# Patient Record
Sex: Female | Born: 1965 | Race: White | Hispanic: No | Marital: Married | Smoking: Never smoker
Health system: Southern US, Community
[De-identification: ages and names within clinical notes are randomized; demographics above are authoritative.]

## PROBLEM LIST (undated history)

## (undated) DIAGNOSIS — J45909 Unspecified asthma, uncomplicated: Secondary | ICD-10-CM

## (undated) DIAGNOSIS — J309 Allergic rhinitis, unspecified: Secondary | ICD-10-CM

## (undated) DIAGNOSIS — Z91018 Allergy to other foods: Secondary | ICD-10-CM

## (undated) HISTORY — DX: Allergy to other foods: Z91.018

## (undated) HISTORY — DX: Allergic rhinitis, unspecified: J30.9

## (undated) HISTORY — DX: Unspecified asthma, uncomplicated: J45.909

## (undated) HISTORY — PX: NASAL SEPTUM SURGERY: SHX37

---

## 1998-09-24 ENCOUNTER — Other Ambulatory Visit: Admission: RE | Admit: 1998-09-24 | Discharge: 1998-09-24 | Payer: Self-pay | Admitting: *Deleted

## 1999-04-13 ENCOUNTER — Other Ambulatory Visit: Admission: RE | Admit: 1999-04-13 | Discharge: 1999-04-13 | Payer: Self-pay | Admitting: *Deleted

## 2000-03-01 ENCOUNTER — Other Ambulatory Visit: Admission: RE | Admit: 2000-03-01 | Discharge: 2000-03-01 | Payer: Self-pay | Admitting: *Deleted

## 2001-05-07 ENCOUNTER — Other Ambulatory Visit: Admission: RE | Admit: 2001-05-07 | Discharge: 2001-05-07 | Payer: Self-pay | Admitting: Obstetrics and Gynecology

## 2001-10-22 ENCOUNTER — Encounter: Admission: RE | Admit: 2001-10-22 | Discharge: 2001-10-22 | Payer: Self-pay | Admitting: *Deleted

## 2002-07-15 ENCOUNTER — Other Ambulatory Visit: Admission: RE | Admit: 2002-07-15 | Discharge: 2002-07-15 | Payer: Self-pay | Admitting: Obstetrics and Gynecology

## 2002-09-07 ENCOUNTER — Encounter: Payer: Self-pay | Admitting: Emergency Medicine

## 2002-09-07 ENCOUNTER — Emergency Department (HOSPITAL_COMMUNITY): Admission: EM | Admit: 2002-09-07 | Discharge: 2002-09-08 | Payer: Self-pay | Admitting: Emergency Medicine

## 2002-09-23 ENCOUNTER — Encounter: Admission: RE | Admit: 2002-09-23 | Discharge: 2002-11-14 | Payer: Self-pay | Admitting: *Deleted

## 2003-07-17 ENCOUNTER — Other Ambulatory Visit: Admission: RE | Admit: 2003-07-17 | Discharge: 2003-07-17 | Payer: Self-pay | Admitting: Obstetrics and Gynecology

## 2004-04-02 ENCOUNTER — Encounter: Admission: RE | Admit: 2004-04-02 | Discharge: 2004-04-02 | Payer: Self-pay | Admitting: Obstetrics and Gynecology

## 2006-01-27 ENCOUNTER — Emergency Department (HOSPITAL_COMMUNITY): Admission: EM | Admit: 2006-01-27 | Discharge: 2006-01-27 | Payer: Self-pay | Admitting: Emergency Medicine

## 2006-02-11 ENCOUNTER — Inpatient Hospital Stay (HOSPITAL_COMMUNITY): Admission: EM | Admit: 2006-02-11 | Discharge: 2006-02-16 | Payer: Self-pay | Admitting: Emergency Medicine

## 2007-09-20 ENCOUNTER — Encounter: Admission: RE | Admit: 2007-09-20 | Discharge: 2007-09-20 | Payer: Self-pay | Admitting: *Deleted

## 2009-08-06 ENCOUNTER — Encounter: Admission: RE | Admit: 2009-08-06 | Discharge: 2009-08-06 | Payer: Self-pay | Admitting: Obstetrics and Gynecology

## 2010-06-13 ENCOUNTER — Encounter: Payer: Self-pay | Admitting: *Deleted

## 2010-10-08 NOTE — Discharge Summary (Signed)
NAMEEMREE, LOCICERO                 ACCOUNT NO.:  [id number]   MEDICAL RECORD NO.:  [id number]          PATIENT TYPE:  INP   LOCATION:  5705                         FACILITY:  [facility]   PHYSICIAN:  Sharlet Salina T. Hoxworth, M.D.DATE OF BIRTH:  [birth year]   DATE OF ADMISSION:  02/11/2006  DATE OF DISCHARGE:  02/16/2006                               DISCHARGE SUMMARY   CHIEF COMPLAINT/REASON FOR ADMISSION:  Michelle Crawford is a 45 year old female  patient who has had less than 24 hours' worth of nausea and vomiting.  She was seen by her primary care physician and was given Phenergan for  supportive care.  Because her symptoms intensified, she presented to  [employer] [facility] for further evaluation.  CT of the  abdomen and pelvis was done as part of a routine workup and this  demonstrated a small bowel obstruction with transition point in her  distal ileum.  She is still having pain at the time with Dr. Carollee Crawford  evaluation.  On exam her vital signs were stable.  She was afebrile.  Her abdomen was mildly distended with scattered occasional bowel sounds  without significant tenderness, no organomegaly.  White count was  moderately elevated at 20,900, hemoglobin 14.4.  Sodium 36, potassium  3.5, CO2 26, BUN 24, creatinine 0.8, glucose 159.  LFTs normal.  CT as  noted.   ADMISSION DIAGNOSES:  The patient was admitted with following diagnoses.  1. Small bowel obstructions possibly due to adhesions from prior      cesarean section.  2. Leukocytosis and dehydration.   HOSPITAL COURSE:  The patient was admitted to the floor initially for  bowel rest and continued observation.  An NG tube was placed.  She was  started on IV fluids and serial labs were monitored.  By later that  night, she had a low grade fever of 99.8.  Her abdomen was much more  comfortable after the placement of the NG tube with only mild abdominal  pain.  Her abdomen was soft with mild abdominal tenderness without  guarding.  Repeat x-rays were planned for the morning.   By the following day, the patient was having a headache related to the  Dilaudid.  Repeat x-ray showed CT contrast in the colon with improving  partial small bowel obstruction.  Her abdomen was soft.  She had active  bowel sounds and the abdomen was nontender.  NG tube was continued.  Dilaudid was discontinued because of headache.   By day #2, the patient was afebrile, vital signs were stable.  Her white  count had normalized to 5700, potassium was slightly low at 3.4.  She  had still not had a bowel movement yet and she had had 800 mL out of her  NG tube in the past 24 hours, but was passing a small amount of flatus.  She complains that her abdomen was still distended.  On exam her abdomen  was distended and soft and nontender.  No bowel sounds were auscultated  and NG returns were bilious.  NG tube remained in place because of  increased NG output and repeat labs were checked.   By day #3, the patient was still having low grade fever but now she was  having more flatus.  White count was normal at 8100.  X-ray shows maybe  minimal decrease in small bowel dilatation and she was still having gas  in the colon.  NG tube was discontinued and clear liquid diet was  started.  Over the next two days, patient continued to improve.  She was  tolerating a clear liquid diet.  She was now passing stool.  Her  potassium was normal at 3.6.  She was having no further postprandial  bloating.  Her diet was advanced and she was instructed on not eating  corn nuts, lettuce or other high residue diet until instructed that this  was okay by a physician and the patient was told that if she was doing  well by February 16, 2006, she would be appropriate for discharge home.  She was still complaining of some mild nausea with her diet but having  flatus. She stated she still felt somewhat uncomfortable and bloated in  the lower abdomen, but exam was  otherwise normal.  A repeat x-ray showed  no obstruction.  These were reviewed with Dr. Johna Sheriff and the patient  was deemed appropriate for discharge home.   FINAL DISCHARGE DIAGNOSES:  1. Abdominal pain secondary to partial small bowel obstruction,      resolved.  2. History of prior cesarean section.  3. Leukocytosis and low grade fever, resolved.   DISCHARGE MEDICATIONS:  Resume home medications.  Pain management  Tylenol over-the-counter for pain.  If you are developing increased  abdominal pain, you need to call the surgeon immediately.  You are also  to call the surgeon if you have prior symptoms or increased belly pain.  Return to work Monday February 20, 2006.   DIET:  Avoid raw vegetables, whole kernel corn, nuts and seeds for least  2 weeks.   FOLLOW UP:  You need to follow up with Dr. Johna Sheriff as needed.      Allison L. Rennis Harding, N.P.      Lorne Skeens. Hoxworth, M.D.  Electronically Signed    ALE/MEDQ  D:  04/10/2006  T:  04/10/2006  Job:  [id number]

## 2010-10-08 NOTE — H&P (Signed)
Michelle Crawford                 ACCOUNT NO.:  [id number]   MEDICAL RECORD NO.:  [id number]          PATIENT TYPE:  INP   LOCATION:  5729                         FACILITY:  [facility]   PHYSICIAN:  Gabrielle Dare. Janee Morn, M.D.DATE OF BIRTH:  18-Sep-1965   DATE OF ADMISSION:  02/11/2006  DATE OF DISCHARGE:                                HISTORY & PHYSICAL   CHIEF COMPLAINT:  Nausea and vomiting.   HISTORY OF PRESENT ILLNESS:  The patient is a 45 year old white female who  developed nausea and vomiting yesterday evening.  The patient was seen by  her primary physician and prescribed Phenergan.  She came to [facility]gency [facility] for further evaluation.  Workup included CT scan of the  abdomen and pelvis which shows a small bowel obstruction with transition  point in her distal ileum.  She is having some mild pain at this time.   PAST MEDICAL HISTORY:  Negative.   PAST SURGICAL HISTORY:  Cesarean section and nasal surgery.   ALLERGIES:  None.   MEDICATIONS:  Phenergan.   REVIEW OF SYSTEMS:  CARDIAC:  Negative.  PULMONARY:  Negative.  GI: See  above.  GU: Negative.  MUSCULOSKELETAL:  Negative.  NEUROPSYCH:  Negative.  The remainder of review of systems is negative.   PHYSICAL EXAMINATION:  VITAL SIGNS:  Temperature is 98.8.  Blood pressure  109/65.  Heart rate 70, respirations 18.  GENERAL:  She is awake, alert and oriented.  HEENT:  Pupils are equal.  Sclerae is clear.  NECK:  Supple with no masses.  LUNGS:  Clear to auscultation with good respiratory excursion.  HEART:  Regular.  Impulse is palpable in the left chest.  ABDOMEN:  Mildly distended.  There are scattered occasional bowel sounds.  There is no significant tenderness.  No organomegaly is noted.  SKIN:  Warm and dry with no rashes.  EXTREMITIES:  Warm with good distal pulses.   DATA REVIEWED:  Includes white blood cell count 20.9, hemoglobin 14.4,  sodium 136, potassium 3.5, chloride 102, CO2 of 26, BUN 24,  creatinine 0.8,  glucose 159.  Liver function test within normal limits.  CT scan of the  abdomen and pelvis has findings as above.   IMPRESSION/PLAN:  Small-bowel obstruction, possibly due to adhesions from  previous cesarean section.  We will plan to admit, place NG tube and give IV  fluid resuscitation, as the patient is quite dehydrated.  We will plan to  follow up x-rays tomorrow morning.  We will closely follow her today and if  she does not improve, she may need exploratory laparotomy.  Plan was  discussed in detail with the patient.      Gabrielle Dare Janee Morn, M.D.  Electronically Signed     BET/MEDQ  D:  02/11/2006  T:  02/14/2006  Job:  [id number]

## 2017-12-18 ENCOUNTER — Telehealth: Payer: Self-pay | Admitting: Allergy and Immunology

## 2017-12-18 ENCOUNTER — Encounter

## 2017-12-18 ENCOUNTER — Ambulatory Visit: Payer: Self-pay | Admitting: Allergy and Immunology

## 2017-12-18 ENCOUNTER — Encounter: Payer: Self-pay | Admitting: Allergy and Immunology

## 2017-12-18 VITALS — BP 120/88 | HR 74 | Temp 98.5°F | Resp 18 | Ht 64.4 in | Wt 170.8 lb

## 2017-12-18 DIAGNOSIS — J4520 Mild intermittent asthma, uncomplicated: Secondary | ICD-10-CM

## 2017-12-18 DIAGNOSIS — H1010 Acute atopic conjunctivitis, unspecified eye: Secondary | ICD-10-CM

## 2017-12-18 DIAGNOSIS — G43909 Migraine, unspecified, not intractable, without status migrainosus: Secondary | ICD-10-CM

## 2017-12-18 DIAGNOSIS — J3089 Other allergic rhinitis: Secondary | ICD-10-CM

## 2017-12-18 MED ORDER — OLOPATADINE HCL 0.1 % OP SOLN: OPHTHALMIC | 5 refills | Status: DC

## 2017-12-18 MED ORDER — MONTELUKAST SODIUM 10 MG PO TABS: 10.0000 mg | ORAL_TABLET | Freq: Every day | ORAL | 5 refills | Status: DC

## 2017-12-18 MED ORDER — ALBUTEROL SULFATE HFA 108 (90 BASE) MCG/ACT IN AERS: INHALATION_SPRAY | RESPIRATORY_TRACT | 1 refills | Status: DC

## 2017-12-18 NOTE — Telephone Encounter (Signed)
Call[facility] patient and [facility]

## 2017-12-18 NOTE — Progress Notes (Signed)
Dear Dr. Sol Passer,  Thank you for referring Michelle Crawford to the [facility] [place] on 12/18/2017.   Below is a summation of this patient's evaluation and recommendations.  Thank you for your referral. I will keep you informed about this patient's response to treatment.   If you have any questions please do not hesitate to contact me.   Sincerely,  Michelle Priest, MD Allergy / Immunology [facility]   ______________________________________________________________________    NEW PATIENT NOTE  Referring Provider: Olive Bass, MD Primary Provider: Olive Bass, MD Date of office visit: 12/18/2017    Subjective:   Chief Complaint:  Michelle Crawford (DOB: 1965-08-22) is a 52 y.o. female who presents to the clinic on 12/18/2017 with a chief complaint of Asthma; Itchy Eye; and Nasal Congestion .     HPI: Michelle Crawford presents to this clinic in evaluation of several issues.  First, she has a multiyear history of itchy watery eyes that she constantly rubs along with nasal congestion and sneezing occurring initially only during the spring with outdoor exposure but now extending into the fall season.  She will use Visine at least one time per day and sometimes multiple times per day.  She has tried a nasal steroid in the past which she did not think helped her very much.  She has tried an over-the-counter nasal decongestant spray which did not help her very much and in fact she found that she developed a dependency on that agent.  Fortunately, she does not use any over-the-counter nasal decongestant spray at this point in time.  Provoking factors for her symptoms include exposure to the outdoors.  Second, she has a history of childhood asthma that occasionally shows up specially during her bad allergy season or when being exposed to dust.  This past spring she has had to use a bronchodilator on a  daily basis.  She does not have cold air induced bronchospastic symptoms or exercise-induced bronchospastic symptoms  Third, she has headaches.  About 1 time per week she will get a frontal headache that sometimes migrates to the back of her head and is pressure-like.  There is usually no associated scotoma or nausea.  However, she does get a bad pounding headache about 1 time per month.  She will take some Tylenol and this helps with her headaches.  She drinks at least 4 diet Cokes per day and does not consume any chocolate or alcohol.  Fourth, she has had a problem when being exposed to shellfish.  Approximately 15 years ago she ate shrimp and developed a "itching" sensation in her swallowing.  She then ate another shrimp some months later and developed sweating and almost passed out with some nausea.  In 2015 she ate a crab dip and developed a similar type of reaction.  Fifth, she has tiny bumps that are developed on the back of her arm over the course of the past few months.  Past Medical History:  Diagnosis Date  . Asthma   . Food allergy    Shellfish allergy    Past Surgical History:  Procedure Laterality Date  . CESAREAN SECTION  11/26/1993  . NASAL SEPTUM SURGERY  age 89    Allergies as of 12/18/2017      Reactions   Morphine And Related Nausea And Vomiting      Medication List      EQ ALLERGY RELIEF  PO Take by mouth daily.   TYLENOL PO Take by mouth as needed.       Review of systems negative except as noted in HPI / PMHx or noted below:  Review of Systems  Constitutional: Negative.   HENT: Negative.   Eyes: Negative.   Respiratory: Negative.   Cardiovascular: Negative.   Gastrointestinal: Negative.   Genitourinary: Negative.   Musculoskeletal: Negative.   Skin: Negative.   Neurological: Negative.   Endo/Heme/Allergies: Negative.   Psychiatric/Behavioral: Negative.     Family History  Problem Relation Age of Onset  . COPD Mother   . Asthma Mother   .  High blood pressure Mother   . COPD Father   . Asthma Father   . High blood pressure Father   . High blood pressure Brother   . Asthma Brother   . Asthma Brother   . High blood pressure Brother   . Lung disease Brother   . Cancer Paternal Grandmother   . Cancer Paternal Grandfather     Social History   Socioeconomic History  . Marital status: Married    Spouse name: Not on file  . Number of children: Not on file  . Years of education: Not on file  . Highest education level: Not on file  Occupational History  . Not on file  Social Needs  . Financial resource strain: Not on file  . Food insecurity:    Worry: Not on file    Inability: Not on file  . Transportation needs:    Medical: Not on file    Non-medical: Not on file  Tobacco Use  . Smoking status: Never Smoker  . Smokeless tobacco: Never Used  Substance and Sexual Activity  . Alcohol use: Not Currently    Frequency: Never  . Drug use: Never  . Sexual activity: Not on file  Lifestyle  . Physical activity:    Days per week: Not on file    Minutes per session: Not on file  . Stress: Not on file  Relationships  . Social connections:    Talks on phone: Not on file    Gets together: Not on file    Attends religious service: Not on file    Active member of club or organization: Not on file    Attends meetings of clubs or organizations: Not on file    Relationship status: Not on file  . Intimate partner violence:    Fear of current or ex partner: Not on file    Emotionally abused: Not on file    Physically abused: Not on file    Forced sexual activity: Not on file  Other Topics Concern  . Not on file  Social History Narrative  . 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  Objective:   Vitals:   12/18/17  1024  BP: 120/88  Pulse: 74  Resp: 18  Temp: 98.5 F (36.9 C)   Height: 5' 4.4" (163.6 cm) Weight: 170 lb 12.8 oz (77.5 kg)  Physical Exam  Constitutional:  Sniffing, nasal crease, rubbing of eyes  HENT:  Head: Normocephalic. Head is without right periorbital erythema and without left periorbital erythema.  Right Ear: Tympanic membrane, external ear and ear canal normal.  Left Ear: Tympanic  membrane, external ear and ear canal normal.  Nose: Mucosal edema present. No rhinorrhea.  Mouth/Throat: Oropharynx is clear and moist and mucous membranes are normal. No oropharyngeal exudate.  Eyes: Pupils are equal, round, and reactive to light. Conjunctivae and lids are normal.  Neck: Trachea normal. No tracheal deviation present. No thyromegaly present.  Cardiovascular: Normal rate, regular rhythm, S1 normal, S2 normal and normal heart sounds.  No murmur heard. Pulmonary/Chest: Effort normal. No stridor. No respiratory distress. She has no wheezes. She has no rales. She exhibits no tenderness.  Abdominal: Soft. She exhibits no distension and no mass. There is no hepatosplenomegaly. There is no tenderness. There is no rebound and no guarding.  Musculoskeletal: She exhibits no edema or tenderness.  Lymphadenopathy:       Head (right side): No tonsillar adenopathy present.       Head (left side): No tonsillar adenopathy present.    She has no cervical adenopathy.    She has no axillary adenopathy.  Neurological: She is alert.  Skin: Rash (Keratosis pilaris upper arm) noted. She is not diaphoretic. No erythema. No pallor. Nails show no clubbing.    Diagnostics: Allergy skin tests were performed.  She demonstrated severe hypersensitivity against grasses, weeds, trees, dust mite, cat, dog, and cockroach  Spirometry was performed and demonstrated an FEV1 of 2.47 @ 88 % of predicted. FEV1/FVC = 0.75.  Following the administration of nebulized albuterol her FEV1 increased to 2.97 which calculated  out to and increase of the FEV1 of 20%  Assessment and Plan:    1. Perennial allergic rhinitis   2. Seasonal allergic conjunctivitis   3. Asthma, mild intermittent, well-controlled   4. Migraine syndrome     1.  Allergy avoidance measures  2.  Treat and prevent inflammation:   A.  OTC Nasacort/Rhinocort 1 spray each nostril 1 time per day  B.  Montelukast 10 mg tablet 1 time per day  3.  Treat and prevent headache:   A.  Slowly consolidate all forms of caffeine consumption  4.  If needed:   A.  Nasal saline  B.  OTC antihistamine -Claritin/Zyrtec/Allegra  C.  Patanol -1 drop each eye twice a day  D.  Pro-air HFA 2 puffs every 4-6 hours  E.  Auvi-Q 0.3, Benadryl, MD/ER evaluation for allergic reaction  5.  Consider a course of immunotherapy  6.  Return to clinic in 4 weeks or earlier if problem   Michelle Crawford has significant immunological hyperreactivity with an atopic phenotype and hopefully she will respond to a combination of allergen avoidance measures and the use of anti-inflammatory agents for her respiratory tract.  As well, she appears to have a chronic headache issue most likely tied up with migraines and caffeine use and I have asked her to slowly taper off caffeine over the next month.  She would definitely be a candidate for immunotherapy if she fails medical treatment and I have given her literature on this form of treatment during today's visit.  I will regroup with her in 4 weeks or earlier if there is a problem.  Michelle PriestEric J. Bryor Rami, MD Allergy / Immunology [facility] [place]

## 2017-12-18 NOTE — Patient Instructions (Addendum)
  1.  Allergy avoidance measures  2.  Treat and prevent inflammation:   A.  OTC Nasacort/Rhinocort 1 spray each nostril 1 time per day  B.  Montelukast 10 mg tablet 1 time per day  3.  Treat and prevent headache:   A.  Slowly consolidate all forms of caffeine consumption  4.  If needed:   A.  Nasal saline  B.  OTC antihistamine -Claritin/Zyrtec/Allegra  C.  Patanol -1 drop each eye twice a day  D.  Pro-air HFA 2 puffs every 4-6 hours  E.  Auvi-Q 0.3, Benadryl, MD/ER evaluation for allergic reaction  5.  Consider a course of immunotherapy  6.  Return to clinic in 4 weeks or earlier if problem

## 2017-12-18 NOTE — Telephone Encounter (Signed)
Patient was seen this morning and had her prescriptions sent to [facility]. She said they do not take the Good Rx card there and was going to have to pay a lot more than it stated. She told them to put them back and [facility] [place].

## 2017-12-18 NOTE — Telephone Encounter (Signed)
Patient called back and said she would like to know what her breathing levels were.

## 2017-12-19 ENCOUNTER — Encounter: Payer: Self-pay | Admitting: Allergy and Immunology

## 2018-01-15 ENCOUNTER — Ambulatory Visit (INDEPENDENT_AMBULATORY_CARE_PROVIDER_SITE_OTHER): Payer: Self-pay | Admitting: Allergy and Immunology

## 2018-01-15 ENCOUNTER — Encounter: Payer: Self-pay | Admitting: Allergy and Immunology

## 2018-01-15 VITALS — BP 128/90 | HR 72 | Resp 14

## 2018-01-15 DIAGNOSIS — J3089 Other allergic rhinitis: Secondary | ICD-10-CM

## 2018-01-15 DIAGNOSIS — L230 Allergic contact dermatitis due to metals: Secondary | ICD-10-CM

## 2018-01-15 DIAGNOSIS — Z91013 Allergy to seafood: Secondary | ICD-10-CM

## 2018-01-15 DIAGNOSIS — H1010 Acute atopic conjunctivitis, unspecified eye: Secondary | ICD-10-CM

## 2018-01-15 DIAGNOSIS — G43909 Migraine, unspecified, not intractable, without status migrainosus: Secondary | ICD-10-CM

## 2018-01-15 DIAGNOSIS — J4520 Mild intermittent asthma, uncomplicated: Secondary | ICD-10-CM

## 2018-01-15 NOTE — Progress Notes (Signed)
Follow-up Note  Referring Provider: Olive Bass, MD Primary Provider: Olive Bass, MD Date of Office Visit: 01/15/2018  Subjective:   Michelle Crawford (DOB: 08/08/1965) is a 52 y.o. female who [facility] on 01/15/2018 in re-evaluation of the following:  HPI: Michelle Crawford presents to this clinic in reevaluation of her atopic disease addressed during her last evaluation of 18 December 2017.  She is much better at this point in time and has resolved all the issues with her eyes and all the issues with her nose and has not had any issues with her asthma and she has not used a short acting bronchodilator and can exercise without any problem and her headaches are not an issue.  She continues to use a nasal steroid and a leukotriene modifier and has performed allergen avoidance measures against house dust mite and to some degree as consolidated her caffeine consumption.  She remains away from all shellfish consumption.  She does relate a history of developing a rash underneath her ring finger.  She has removed her wedding ring and her rash gets better.  She has been using some over-the-counter Neosporin.  When she places her ring back on her skin flames up with redness and itchiness.  Allergies as of 01/15/2018      Reactions   Morphine And Related Nausea And Vomiting   Shellfish Allergy       Medication List      albuterol 108 (90 Base) MCG/ACT inhaler Commonly known as:  PROVENTIL HFA;VENTOLIN HFA Inhale two puffs every four to six hours as needed for cough or wheeze.   EQ ALLERGY RELIEF PO Take by mouth daily.   montelukast 10 MG tablet Commonly known as:  SINGULAIR Take 1 tablet (10 mg total) by mouth at bedtime.   olopatadine 0.1 % ophthalmic solution Commonly known as:  PATANOL Can use one drop in each eye twice daily if needed.   TYLENOL PO Take by mouth as needed.       Past Medical History:  Diagnosis Date  . Asthma   . Food  allergy    Shellfish allergy    Past Surgical History:  Procedure Laterality Date  . CESAREAN SECTION  11/26/1993  . NASAL SEPTUM SURGERY  age 8    Review of systems negative except as noted in HPI / PMHx or noted below:  Review of Systems  Constitutional: Negative.   HENT: Negative.   Eyes: Negative.   Respiratory: Negative.   Cardiovascular: Negative.   Gastrointestinal: Negative.   Genitourinary: Negative.   Musculoskeletal: Negative.   Skin: Negative.   Neurological: Negative.   Endo/Heme/Allergies: Negative.   Psychiatric/Behavioral: Negative.      Objective:   Vitals:   01/15/18 1101  BP: 128/90  Pulse: 72  Resp: 14          Physical Exam  HENT:  Head: Normocephalic.  Right Ear: Tympanic membrane, external ear and ear canal normal.  Left Ear: Tympanic membrane, external ear and ear canal normal.  Nose: Nose normal. No mucosal edema or rhinorrhea.  Mouth/Throat: Uvula is midline, oropharynx is clear and moist and mucous membranes are normal. No oropharyngeal exudate.  Eyes: Conjunctivae are normal.  Neck: Trachea normal. No tracheal tenderness present. No tracheal deviation present. No thyromegaly present.  Cardiovascular: Normal rate, regular rhythm, S1 normal, S2 normal and normal heart sounds.  No murmur heard. Pulmonary/Chest: Breath sounds normal. No stridor. No respiratory distress. She has  no wheezes. She has no rales.  Musculoskeletal: She exhibits no edema.  Lymphadenopathy:       Head (right side): No tonsillar adenopathy present.       Head (left side): No tonsillar adenopathy present.    She has no cervical adenopathy.  Neurological: She is alert.  Skin: Rash (Left ring finger erythema, induration, slight scale at site of metal contact) noted. She is not diaphoretic. No erythema. Nails show no clubbing.    Diagnostics:    Spirometry was performed and demonstrated an FEV1 of 2.67 at 96 % of predicted.  The patient had an Asthma Control  Test with the following results: ACT Total Score: 23.    Assessment and Plan:   1. Perennial allergic rhinitis   2. Seasonal allergic conjunctivitis   3. Asthma, mild intermittent, well-controlled   4. Migraine syndrome   5. Shellfish allergy   6. Allergic contact dermatitis due to metals     1.  Continue to perform Allergy avoidance measures  2.  Continue to Treat and prevent inflammation:   A.  OTC Nasacort/Rhinocort 1 spray each nostril 3-7 times per week  B.  Montelukast 10 mg tablet 1 time per day  3.  Treat and prevent headache:   A.  Slowly consolidate all forms of caffeine consumption  4.  If needed:   A.  Nasal saline  B.  OTC antihistamine -Claritin/Zyrtec/Allegra  C.  Patanol -1 drop each eye twice a day  D.  Pro-air HFA 2 puffs every 4-6 hours  E.  Auvi-Q 0.3, Benadryl, MD/ER evaluation for allergic reaction  5.  For metal induced contact dermatitis, remove ring, use OTC Hydrocortisone ointment, seal ring with nail polish, and place ring back on finger.  6. Obtain fall flu vaccine  7.  Return to clinic in January 2020 or earlier if problem   Michelle Crawford appears to be doing very well on her current plan which includes allergen avoidance measures and the use of anti-inflammatory agents for her airway.  She needs to find a dose of nasal steroid that works well for her and I recommended somewhere between 3-7 times per week.  She needs to go through each season of the year to see how well this plan works.  If she fails medical therapy she would be a candidate for immunotherapy.  As well, she appears to have metal induced contact dermatitis and will try a very simple approach as noted above to address this issue.  If she does well I will see her back in this clinic in 6 months or earlier if there is a problem.  Laurette SchimkeEric Nickia Boesen, MD Allergy / Immunology [facility]

## 2018-01-15 NOTE — Patient Instructions (Addendum)
  1.  Continue to perform Allergy avoidance measures  2.  Continue to Treat and prevent inflammation:   A.  OTC Nasacort/Rhinocort 1 spray each nostril 3-7 times per week  B.  Montelukast 10 mg tablet 1 time per day  3.  Treat and prevent headache:   A.  Slowly consolidate all forms of caffeine consumption  4.  If needed:   A.  Nasal saline  B.  OTC antihistamine -Claritin/Zyrtec/Allegra  C.  Patanol -1 drop each eye twice a day  D.  Pro-air HFA 2 puffs every 4-6 hours  E.  Auvi-Q 0.3, Benadryl, MD/ER evaluation for allergic reaction  5.  For metal induced contact dermatitis remove ring, use OTC Hydrocortisone ointment, seal ring with nail polish, and place ring back on finger.  6. Obtain fall flu vaccine  7.  Return to clinic in January 2020 or earlier if problem

## 2018-01-16 ENCOUNTER — Encounter: Payer: Self-pay | Admitting: Allergy and Immunology

## 2018-02-09 ENCOUNTER — Telehealth: Payer: Self-pay | Admitting: Allergy and Immunology

## 2018-02-09 NOTE — Telephone Encounter (Signed)
Michelle Crawford called in and had some concerns about her bill.  She states she was surprised to be charged for a follow up which I told her she would be.  Then stated she was surpised a follow up cost more than her new patient visit.  Michelle Crawford has some questions and concerns and would like a call back so she may have her bill explained to her.

## 2018-02-09 NOTE — Telephone Encounter (Signed)
Called patient back and left a message.  Explained that she had a spirometry on the follow-up visit, but not on the first visit.  Told her to call me back with any questions - kt

## 2018-09-28 ENCOUNTER — Other Ambulatory Visit: Payer: Self-pay | Admitting: Allergy and Immunology

## 2018-11-01 ENCOUNTER — Ambulatory Visit (INDEPENDENT_AMBULATORY_CARE_PROVIDER_SITE_OTHER): Payer: Self-pay | Admitting: Allergy and Immunology

## 2018-11-01 ENCOUNTER — Other Ambulatory Visit: Payer: Self-pay

## 2018-11-01 ENCOUNTER — Encounter: Payer: Self-pay | Admitting: Allergy and Immunology

## 2018-11-01 VITALS — BP 122/92 | HR 76 | Resp 14

## 2018-11-01 DIAGNOSIS — H1010 Acute atopic conjunctivitis, unspecified eye: Secondary | ICD-10-CM

## 2018-11-01 DIAGNOSIS — Z91013 Allergy to seafood: Secondary | ICD-10-CM

## 2018-11-01 DIAGNOSIS — L230 Allergic contact dermatitis due to metals: Secondary | ICD-10-CM

## 2018-11-01 DIAGNOSIS — G43909 Migraine, unspecified, not intractable, without status migrainosus: Secondary | ICD-10-CM

## 2018-11-01 DIAGNOSIS — J3089 Other allergic rhinitis: Secondary | ICD-10-CM

## 2018-11-01 DIAGNOSIS — J4520 Mild intermittent asthma, uncomplicated: Secondary | ICD-10-CM

## 2018-11-01 MED ORDER — MONTELUKAST SODIUM 10 MG PO TABS: ORAL_TABLET | ORAL | 11 refills | Status: DC

## 2018-11-01 NOTE — Patient Instructions (Signed)
  1.  Continue to perform Allergy avoidance measures  2.  Continue to Treat and prevent inflammation:   A.  OTC Nasacort 1 spray each nostril 3-7 times per week  B.  Montelukast 10 mg tablet 1 time per day  3.  Treat and prevent headache:   A.  Slowly consolidate all forms of caffeine consumption  4.  If needed:   A.  Nasal saline  B.  OTC antihistamine -Claritin/Zyrtec/Allegra  C.  Patanol -1 drop each eye twice a day  D.  Pro-air HFA 2 puffs every 4-6 hours  E.  Auvi-Q 0.3, Benadryl, MD/ER evaluation for allergic reaction  5.  For metal induced contact dermatitis remove ring, use OTC Hydrocortisone ointment and seal ring with nail polish   6. Obtain fall flu vaccine  7.  Return to clinic in 12 months or earlier if problem

## 2018-11-01 NOTE — Progress Notes (Signed)
[place]   Follow-up Note  Referring Provider: Olive Bassough, Robert L, MD Primary Provider: Olive Bassough, Robert L, MD Date of Office Visit: 11/01/2018  Subjective:   Michelle Crawford (DOB: 1966-01-16) is a 53 y.o. female who [facility] in re-evaluation of the following:  HPI: Silva BandyKristi returns to this clinic in evaluation of intermittent asthma, allergic rhinoconjunctivitis, headache, metal induced contact dermatitis, and shellfish allergy.  Her last visit to this clinic was 15 January 2018.  She has really done relatively well with her airway through each season of the year.  She has not required a systemic steroid or antibiotic to treat any type of respiratory tract issue.  She rarely uses any short acting bronchodilator and can exert herself without any problem.  She has had very little issues with her eyes.  She has had very little issues with her nose.  She continues to use nasal steroids and montelukast on a pretty consistent basis.  She is not wearing her ring anymore and her dermatitis has for the most part resolved.  Her headaches appear to be a minimal issue at this point in time.  She still consumes significant amounts of caffeine throughout the day.  She remains away from consuming shellfish.  Allergies as of 11/01/2018      Reactions   Morphine And Related Nausea And Vomiting   Shellfish Allergy       Medication List      albuterol 108 (90 Base) MCG/ACT inhaler Commonly known as: VENTOLIN HFA Inhale two puffs every four to six hours as needed for cough or wheeze.   GLUCOSAMINE-CHONDROITIN PO Take by mouth daily.   montelukast 10 MG tablet Commonly known as: SINGULAIR Take one tablet once daily as directed   Nasacort Allergy 24HR 55 MCG/ACT Aero nasal inhaler Generic drug: triamcinolone Place 1 spray into the nose daily.   olopatadine 0.1 % ophthalmic solution Commonly known  as: PATANOL Can use one drop in each eye twice daily if needed.   TYLENOL PO Take by mouth as needed.       Past Medical History:  Diagnosis Date  . Allergic rhinitis   . Asthma   . Food allergy    Shellfish allergy    Past Surgical History:  Procedure Laterality Date  . CESAREAN SECTION  11/26/1993  . NASAL SEPTUM SURGERY  age 53    Review of systems negative except as noted in HPI / PMHx or noted below:  Review of Systems  Constitutional: Negative.   HENT: Negative.   Eyes: Negative.   Respiratory: Negative.   Cardiovascular: Negative.   Gastrointestinal: Negative.   Genitourinary: Negative.   Musculoskeletal: Negative.   Skin: Negative.   Neurological: Negative.   Endo/Heme/Allergies: Negative.   Psychiatric/Behavioral: Negative.      Objective:   Vitals:   11/01/18 0847  BP: (!) 122/92  Pulse: 76  Resp: 14  SpO2: 97%          Physical Exam Constitutional:      Appearance: She is not diaphoretic.  HENT:     Head: Normocephalic.     Right Ear: Tympanic membrane, ear canal and external ear normal.     Left Ear: Tympanic membrane, ear canal and external ear normal.     Nose: Nose normal. No mucosal edema or rhinorrhea.     Mouth/Throat:     Pharynx: Uvula midline. No oropharyngeal exudate.  Eyes:  Conjunctiva/sclera: Conjunctivae normal.  Neck:     Thyroid: No thyromegaly.     Trachea: Trachea normal. No tracheal tenderness or tracheal deviation.  Cardiovascular:     Rate and Rhythm: Normal rate and regular rhythm.     Heart sounds: Normal heart sounds, S1 normal and S2 normal. No murmur.  Pulmonary:     Effort: No respiratory distress.     Breath sounds: Normal breath sounds. No stridor. No wheezing or rales.  Lymphadenopathy:     Head:     Right side of head: No tonsillar adenopathy.     Left side of head: No tonsillar adenopathy.     Cervical: No cervical adenopathy.  Skin:    Findings: No erythema or rash.     Nails: There is no  clubbing.   Neurological:     Mental Status: She is alert.     Diagnostics: none  Assessment and Plan:   1. Perennial allergic rhinitis   2. Seasonal allergic conjunctivitis   3. Asthma, mild intermittent, well-controlled   4. Migraine syndrome   5. Shellfish allergy   6. Allergic contact dermatitis due to metals     1.  Continue to perform Allergy avoidance measures  2.  Continue to Treat and prevent inflammation:   A.  OTC Nasacort 1 spray each nostril 3-7 times per week  B.  Montelukast 10 mg tablet 1 time per day  3.  Treat and prevent headache:   A.  Slowly consolidate all forms of caffeine consumption  4.  If needed:   A.  Nasal saline  B.  OTC antihistamine -Claritin/Zyrtec/Allegra  C.  Patanol -1 drop each eye twice a day  D.  Pro-air HFA 2 puffs every 4-6 hours  E.  Auvi-Q 0.3, Benadryl, MD/ER evaluation for allergic reaction  5.  For metal induced contact dermatitis remove ring, use OTC Hydrocortisone ointment and seal ring with nail polish   6. Obtain fall flu vaccine  7.  Return to clinic in 12 months or earlier if problem   Danely appears to be doing quite well.  She has a very good understanding of her disease state and how her medications work and the appropriate dosing of these medications.  We will refill all of her medications and see her back in this clinic in 12 months or earlier if there is a problem.  Incidentally, I did have a talk with her today about obtaining the flu vaccine this fall and also obtaining a colonoscopy given the fact that she is 53 years old and has not obtained a colonoscopy to date.  Allena Katz, MD Allergy / Immunology [place]

## 2018-11-05 ENCOUNTER — Encounter: Payer: Self-pay | Admitting: Allergy and Immunology

## 2019-03-26 ENCOUNTER — Other Ambulatory Visit: Payer: Self-pay | Admitting: Allergy and Immunology

## 2019-10-31 ENCOUNTER — Ambulatory Visit: Payer: Self-pay | Admitting: Allergy and Immunology

## 2019-12-09 ENCOUNTER — Other Ambulatory Visit: Payer: Self-pay | Admitting: Allergy and Immunology

## 2019-12-16 ENCOUNTER — Encounter: Payer: Self-pay | Admitting: Allergy and Immunology

## 2019-12-16 ENCOUNTER — Ambulatory Visit: Payer: Self-pay | Admitting: Allergy and Immunology

## 2019-12-16 ENCOUNTER — Encounter (INDEPENDENT_AMBULATORY_CARE_PROVIDER_SITE_OTHER): Payer: Self-pay

## 2019-12-16 ENCOUNTER — Other Ambulatory Visit: Payer: Self-pay

## 2019-12-16 VITALS — BP 138/92 | HR 76 | Resp 16 | Ht 64.3 in | Wt 185.8 lb

## 2019-12-16 DIAGNOSIS — Z91013 Allergy to seafood: Secondary | ICD-10-CM

## 2019-12-16 DIAGNOSIS — J3089 Other allergic rhinitis: Secondary | ICD-10-CM

## 2019-12-16 DIAGNOSIS — G43909 Migraine, unspecified, not intractable, without status migrainosus: Secondary | ICD-10-CM

## 2019-12-16 DIAGNOSIS — H1010 Acute atopic conjunctivitis, unspecified eye: Secondary | ICD-10-CM

## 2019-12-16 DIAGNOSIS — L230 Allergic contact dermatitis due to metals: Secondary | ICD-10-CM

## 2019-12-16 DIAGNOSIS — H9313 Tinnitus, bilateral: Secondary | ICD-10-CM

## 2019-12-16 DIAGNOSIS — J4520 Mild intermittent asthma, uncomplicated: Secondary | ICD-10-CM

## 2019-12-16 MED ORDER — MONTELUKAST SODIUM 10 MG PO TABS: ORAL_TABLET | ORAL | 11 refills | Status: DC

## 2019-12-16 NOTE — Patient Instructions (Addendum)
  1.  Continue to perform Allergy avoidance measures  2.  Continue to Treat and prevent inflammation:   A.  OTC Nasacort 1 spray each nostril 3-7 times per week  B.  Montelukast 10 mg tablet 1 time per day  3.  Continue to Treat and prevent headache:   A.  Consolidate all forms of caffeine consumption  4.  If needed:   A.  Nasal saline  B.  OTC antihistamine -Claritin/Zyrtec/Allegra  C.  Patanol -1 drop each eye twice a day  D.  Pro-air HFA 2 puffs every 4-6 hours  E.  Auvi-Q 0.3, Benadryl, MD/ER evaluation for allergic reaction  5.  For metal induced contact dermatitis remove ring, use OTC Hydrocortisone ointment and seal ring with nail polish   6. Obtain ENT evaluation for tinnitus. Prozac side effect?  7.  Return to clinic in 12 months or earlier if problem

## 2019-12-16 NOTE — Progress Notes (Signed)
[place] - [place] - [place] - [place] - [place]   Follow-up Note  Referring Provider: Olive Bass, MD Primary Provider: Olive Bass, MD Date of Office Visit: 12/16/2019  Subjective:   Michelle Crawford (DOB: 10-11-1965) is a 54 y.o. female who [facility] 12/16/2019 in re-evaluation of the following:  HPI: Michelle Crawford returns to this clinic in evaluation of intermittent asthma, allergic rhinoconjunctivitis, headache, metal induced contact dermatitis, and shellfish allergy. Her last visit to this clinic was 01 November 2018.  She has done very well with her airway.  She rarely uses a short acting bronchodilator.  She continues to use her montelukast on a consistent basis and occasionally some nasal steroid.  Apparently 2 months ago she developed "bronchitis" manifested as coughing and upper respiratory tract symptoms in the association with a fever and a grandson who had a "cold".  She apparently was treated with a steroid and antibiotic for that issue.  Her metal induced contact dermatitis is well handled with some avoidance measures to metals.  She still occasionally wears a ring but once her eczema starts to flareup she will take off the ring for a period in time.  She has tried to coat her ring with acrylic but it makes the ring too tight and she cannot wear her ring.  She has not been having any headaches as long as she minimizes caffeine consumption.  She does not consume shellfish.  For the past 3 months or so she has been developing bilateral tinnitus without any vertigo or hearing loss.  Should be noted that this does correlate with the use of Prozac which she started about 4 months ago.  She will not be receiving the Covid vaccine or the flu vaccine.  Allergies as of 12/16/2019      Reactions   Morphine And Related Nausea And Vomiting   Shellfish Allergy       Medication List    albuterol 108 (90 Base) MCG/ACT  inhaler Commonly known as: VENTOLIN HFA INHALE 2 PUFFS BY MOUTH EVERY 4 TO 6 HOURS AS NEEDED FOR COUGH OR  WHEEZE   CALCIUM PO Take by mouth.   Fluoxetine HCl (PMDD) 10 MG Tabs TAKE 1 TABLET BY MOUTH ONCE DAILY FOR  DEPRESSION   montelukast 10 MG tablet Commonly known as: SINGULAIR TAKE 1 TABLET BY MOUTH ONCE DAILY AS DIRECTED   Nasacort Allergy 24HR 55 MCG/ACT Aero nasal inhaler Generic drug: triamcinolone Place 1 spray into the nose daily.   olopatadine 0.1 % ophthalmic solution Commonly known as: PATANOL INSTILL 1 DROP INTO EACH EYE TWICE DAILY IF  NEEDED   TYLENOL PO Take by mouth as needed.   VITAMIN C PO Take by mouth.       Past Medical History:  Diagnosis Date  . Allergic rhinitis   . Asthma   . Food allergy    Shellfish allergy    Past Surgical History:  Procedure Laterality Date  . CESAREAN SECTION  11/26/1993  . NASAL SEPTUM SURGERY  age 38    Review of systems negative except as noted in HPI / PMHx or noted below:  Review of Systems  Constitutional: Negative.   HENT: Negative.   Eyes: Negative.   Respiratory: Negative.   Cardiovascular: Negative.   Gastrointestinal: Negative.   Genitourinary: Negative.   Musculoskeletal: Negative.   Skin: Negative.   Neurological: Negative.   Endo/Heme/Allergies: Negative.   Psychiatric/Behavioral: Negative.      Objective:  Vitals:   12/16/19 1342  BP: (!) 138/92  Pulse: 76  Resp: 16  SpO2: 97%   Height: 5' 4.3" (163.3 cm)  Weight: 185 lb 12.8 oz (84.3 kg)   Physical Exam Constitutional:      Appearance: She is not diaphoretic.  HENT:     Head: Normocephalic.     Right Ear: Tympanic membrane, ear canal and external ear normal.     Left Ear: Tympanic membrane, ear canal and external ear normal.     Nose: Nose normal. No mucosal edema or rhinorrhea.     Mouth/Throat:     Pharynx: Uvula midline. No oropharyngeal exudate.  Eyes:     Conjunctiva/sclera: Conjunctivae normal.  Neck:      Thyroid: No thyromegaly.     Trachea: Trachea normal. No tracheal tenderness or tracheal deviation.  Cardiovascular:     Rate and Rhythm: Normal rate and regular rhythm.     Heart sounds: Normal heart sounds, S1 normal and S2 normal. No murmur heard.   Pulmonary:     Effort: No respiratory distress.     Breath sounds: Normal breath sounds. No stridor. No wheezing or rales.  Lymphadenopathy:     Head:     Right side of head: No tonsillar adenopathy.     Left side of head: No tonsillar adenopathy.     Cervical: No cervical adenopathy.  Skin:    Findings: No erythema or rash.     Nails: There is no clubbing.  Neurological:     Mental Status: She is alert.     Diagnostics:    Spirometry was performed and demonstrated an FEV1 of 2.54 at 92 % of predicted.  Assessment and Plan:   1. Perennial allergic rhinitis   2. Seasonal allergic conjunctivitis   3. Asthma, mild intermittent, well-controlled   4. Migraine syndrome   5. Shellfish allergy   6. Allergic contact dermatitis due to metals   7. Tinnitus, bilateral     1.  Continue to perform Allergy avoidance measures  2.  Continue to Treat and prevent inflammation:   A.  OTC Nasacort 1 spray each nostril 3-7 times per week  B.  Montelukast 10 mg tablet 1 time per day  3.  Continue to Treat and prevent headache:   A.  Consolidate all forms of caffeine consumption  4.  If needed:   A.  Nasal saline  B.  OTC antihistamine -Claritin/Zyrtec/Allegra  C.  Patanol -1 drop each eye twice a day  D.  Pro-air HFA 2 puffs every 4-6 hours  E.  Auvi-Q 0.3, Benadryl, MD/ER evaluation for allergic reaction  5.  For metal induced contact dermatitis remove ring, use OTC Hydrocortisone ointment and seal ring with nail polish   6. Obtain ENT evaluation for tinnitus. Prozac side effect?  7.  Return to clinic in 12 months or earlier if problem   Overall [name] is really doing quite well with her current therapy.  She has a very good  understanding of her disease state and the appropriate use of her medications.  I will see her back in this clinic in 12 months or earlier if there is a problem.  We will refer her onto ENT in evaluation of her tinnitus which may actually turn out to be a Prozac induced side effect.  Laurette Schimke, MD Allergy / Immunology [facility]

## 2019-12-17 ENCOUNTER — Encounter: Payer: Self-pay | Admitting: Allergy and Immunology

## 2019-12-18 ENCOUNTER — Telehealth: Payer: Self-pay | Admitting: Allergy and Immunology

## 2019-12-18 NOTE — Telephone Encounter (Signed)
Kaylany has been referred to [facility].  Referral has been placed in Epic and faxed to [facility] and they will call patient to schedule. I will follow up on 7/30.

## 2019-12-23 NOTE — Telephone Encounter (Signed)
Called [facility] and they are still working on referral.  °

## 2019-12-24 ENCOUNTER — Encounter: Payer: Self-pay | Admitting: *Deleted

## 2019-12-27 ENCOUNTER — Telehealth: Payer: Self-pay | Admitting: Allergy and Immunology

## 2019-12-27 NOTE — Telephone Encounter (Signed)
[facility] and [place] is scheduled with Dr. Marcheta Grammes on 01/20/2020 at 10:00am.

## 2020-01-25 ENCOUNTER — Other Ambulatory Visit: Payer: Self-pay | Admitting: Allergy and Immunology

## 2020-07-22 ENCOUNTER — Other Ambulatory Visit: Payer: Self-pay

## 2020-07-22 MED ORDER — ALBUTEROL SULFATE HFA 108 (90 BASE) MCG/ACT IN AERS: INHALATION_SPRAY | RESPIRATORY_TRACT | 1 refills | Status: DC

## 2020-08-26 LAB — COLOGUARD: COLOGUARD: NEGATIVE

## 2021-02-25 ENCOUNTER — Ambulatory Visit (INDEPENDENT_AMBULATORY_CARE_PROVIDER_SITE_OTHER): Payer: Self-pay | Admitting: Allergy and Immunology

## 2021-02-25 ENCOUNTER — Encounter: Payer: Self-pay | Admitting: Allergy and Immunology

## 2021-02-25 ENCOUNTER — Other Ambulatory Visit: Payer: Self-pay

## 2021-02-25 ENCOUNTER — Other Ambulatory Visit: Payer: Self-pay | Admitting: Allergy and Immunology

## 2021-02-25 VITALS — BP 128/78 | HR 68 | Ht 64.5 in | Wt 184.0 lb

## 2021-02-25 DIAGNOSIS — J4520 Mild intermittent asthma, uncomplicated: Secondary | ICD-10-CM

## 2021-02-25 DIAGNOSIS — J3089 Other allergic rhinitis: Secondary | ICD-10-CM

## 2021-02-25 DIAGNOSIS — H1010 Acute atopic conjunctivitis, unspecified eye: Secondary | ICD-10-CM

## 2021-02-25 DIAGNOSIS — G43909 Migraine, unspecified, not intractable, without status migrainosus: Secondary | ICD-10-CM

## 2021-02-25 DIAGNOSIS — Z91013 Allergy to seafood: Secondary | ICD-10-CM

## 2021-02-25 MED ORDER — ALBUTEROL SULFATE HFA 108 (90 BASE) MCG/ACT IN AERS: INHALATION_SPRAY | RESPIRATORY_TRACT | 1 refills | Status: DC

## 2021-02-25 MED ORDER — MONTELUKAST SODIUM 10 MG PO TABS: ORAL_TABLET | ORAL | 11 refills | Status: DC

## 2021-02-25 NOTE — Patient Instructions (Signed)
  1.  Continue to perform Allergy avoidance measures  2.  Continue to Treat and prevent inflammation:   A.  OTC Nasacort 1 spray each nostril 3-7 times per week  B.  Montelukast 10 mg tablet 1 time per day  C.  Prednisone 20 mg delivered in clinic today  3.  Continue to Treat and prevent headache:   A.  Consolidate caffeine consumption  4.  If needed:   A.  Nasal saline  B.  OTC antihistamine -Claritin/Zyrtec/Allegra  C.  Patanol -1 drop each eye twice a day  D.  Pro-air HFA 2 puffs every 4-6 hours  E.  Auvi-Q 0.3, Benadryl, MD/ER evaluation for allergic reaction  5.  For metal induced contact dermatitis remove ring, use OTC Hydrocortisone ointment and seal ring with nail polish   6. Return to clinic in 12 months or earlier if problem

## 2021-02-25 NOTE — Progress Notes (Signed)
[place] - [place] - [place] - [place] - [place]   Follow-up Note  Referring Provider: Olive Bass, MD Primary Provider: Olive Bass, MD Date of Office Visit: 02/25/2021  Subjective:   Michelle Crawford (DOB: Mar 28, 1966) is a 55 y.o. female who [facility] on 02/25/2021 in re-evaluation of the following:  HPI: Intisar presents to this clinic in reevaluation of asthma and allergic rhinoconjunctivitis and headache syndrome and metal induced contact dermatitis and shellfish allergy.  I last saw her in this clinic on 16 December 2019.  Her upper airway issue is doing quite well as long as she performs allergen avoidance measures and utilizes montelukast and some nasal steroid.  Unfortunately, she had a few days of cat exposure recently while visiting with her brother and she developed significant congestion of her upper airway.  She has had no issue with her lower airway and has not required a systemic steroid or an antibiotic for any type of significant airway problem and does not use any short acting bronchodilator.  Her metal induced contact dermatitis will flare if she wears metal and she will use some hydrocortisone ointment when needed.  Headaches are not an issue with attention to caffeine consumption.  She does not consume shellfish.  She contracted COVID in April 2022 manifested as fever and anosmia and fatigue for which she was treated with antiviral agents.  She does not receive COVID vaccines or flu vaccines.  When she was last seen in this clinic she had significant tinnitus on the right and we referred her to ENT and unfortunately nothing can be done about this issue.  Allergies as of 02/25/2021       Reactions   Morphine And Related Nausea And Vomiting   Shellfish Allergy         Medication List    albuterol 108 (90 Base) MCG/ACT inhaler Commonly known as: VENTOLIN HFA Can inhale two puffs every four to six hours as  needed for cough or wheeze.   CALCIUM PO Take by mouth.   Fluoxetine HCl (PMDD) 10 MG Tabs TAKE 1 TABLET BY MOUTH ONCE DAILY FOR  DEPRESSION   montelukast 10 MG tablet Commonly known as: SINGULAIR TAKE 1 TABLET BY MOUTH ONCE DAILY AS DIRECTED   olopatadine 0.1 % ophthalmic solution Commonly known as: PATANOL INSTILL 1 DROP INTO EACH EYE TWICE DAILY AS NEEDED   triamcinolone 55 MCG/ACT Aero nasal inhaler Commonly known as: NASACORT Place 1 spray into the nose daily.   TYLENOL PO Take by mouth as needed.   VITAMIN C PO Take by mouth.    Past Medical History:  Diagnosis Date   Allergic rhinitis    Asthma    Food allergy    Shellfish allergy    Past Surgical History:  Procedure Laterality Date   CESAREAN SECTION  11/26/1993   NASAL SEPTUM SURGERY  age 31    Review of systems negative except as noted in HPI / PMHx or noted below:  Review of Systems  Constitutional: Negative.   HENT: Negative.    Eyes: Negative.   Respiratory: Negative.    Cardiovascular: Negative.   Gastrointestinal: Negative.   Genitourinary: Negative.   Musculoskeletal: Negative.   Skin: Negative.   Neurological: Negative.   Endo/Heme/Allergies: Negative.   Psychiatric/Behavioral: Negative.      Objective:   Vitals:   02/25/21 1053  BP: 128/78  Pulse: 68  SpO2: 95%   Height: 5' 4.5" (163.8 cm)  Weight: 184 lb (83.5 kg)   Physical Exam Constitutional:      Appearance: She is not diaphoretic.  HENT:     Head: Normocephalic.     Right Ear: Tympanic membrane, ear canal and external ear normal.     Left Ear: Tympanic membrane, ear canal and external ear normal.     Nose: Septal deviation and mucosal edema present. No rhinorrhea.     Mouth/Throat:     Pharynx: Uvula midline. No oropharyngeal exudate.  Eyes:     Conjunctiva/sclera: Conjunctivae normal.  Neck:     Thyroid: No thyromegaly.     Trachea: Trachea normal. No tracheal tenderness or tracheal deviation.   Cardiovascular:     Rate and Rhythm: Normal rate and regular rhythm.     Heart sounds: Normal heart sounds, S1 normal and S2 normal. No murmur heard. Pulmonary:     Effort: No respiratory distress.     Breath sounds: Normal breath sounds. No stridor. No wheezing or rales.  Lymphadenopathy:     Head:     Right side of head: No tonsillar adenopathy.     Left side of head: No tonsillar adenopathy.     Cervical: No cervical adenopathy.  Skin:    Findings: No erythema or rash.     Nails: There is no clubbing.  Neurological:     Mental Status: She is alert.    Diagnostics: none  Assessment and Plan:   1. Perennial allergic rhinitis   2. Seasonal allergic conjunctivitis   3. Asthma, mild intermittent, well-controlled   4. Migraine syndrome   5. Shellfish allergy     1.  Continue to perform Allergy avoidance measures  2.  Continue to Treat and prevent inflammation:   A.  OTC Nasacort 1 spray each nostril 3-7 times per week  B.  Montelukast 10 mg tablet 1 time per day  C.  Prednisone 20 mg delivered in clinic today  3.  Continue to Treat and prevent headache:   A.  Consolidate caffeine consumption  4.  If needed:   A.  Nasal saline  B.  OTC antihistamine -Claritin/Zyrtec/Allegra  C.  Patanol -1 drop each eye twice a day  D.  Pro-air HFA 2 puffs every 4-6 hours  E.  Auvi-Q 0.3, Benadryl, MD/ER evaluation for allergic reaction  5.  For metal induced contact dermatitis remove ring, use OTC Hydrocortisone ointment and seal ring with nail polish   6. Return to clinic in 12 months or earlier if problem  I have given [name] a very short course of systemic steroids to help with her cat induced mucosal inflammation and she will restart her montelukast and Nasacort to help with this issue.  She will remain away from significant caffeine consumption to prevent her headaches.  She has a short acting bronchodilator and a injectable epinephrine device should it be required.  We will  see her back in his clinic in 1 year or earlier if there is a problem.   Laurette Schimke, MD Allergy / Immunology [facility]

## 2021-03-01 ENCOUNTER — Encounter: Payer: Self-pay | Admitting: Allergy and Immunology

## 2021-06-05 ENCOUNTER — Emergency Department (HOSPITAL_COMMUNITY): Payer: BLUE CROSS/BLUE SHIELD

## 2021-06-05 ENCOUNTER — Encounter (HOSPITAL_COMMUNITY): Payer: Self-pay | Admitting: Emergency Medicine

## 2021-06-05 ENCOUNTER — Emergency Department (HOSPITAL_COMMUNITY)
Admission: EM | Admit: 2021-06-05 | Discharge: 2021-06-05 | Disposition: A | Discharge status: Home / Self Care | Payer: BLUE CROSS/BLUE SHIELD | Attending: Emergency Medicine | Admitting: Emergency Medicine

## 2021-06-05 ENCOUNTER — Other Ambulatory Visit: Payer: Self-pay

## 2021-06-05 DIAGNOSIS — R1013 Epigastric pain: Secondary | ICD-10-CM | POA: Diagnosis present

## 2021-06-05 DIAGNOSIS — K7689 Other specified diseases of liver: Secondary | ICD-10-CM | POA: Insufficient documentation

## 2021-06-05 DIAGNOSIS — R109 Unspecified abdominal pain: Secondary | ICD-10-CM

## 2021-06-05 DIAGNOSIS — Z79899 Other long term (current) drug therapy: Secondary | ICD-10-CM | POA: Insufficient documentation

## 2021-06-05 DIAGNOSIS — R933 Abnormal findings on diagnostic imaging of other parts of digestive tract: Secondary | ICD-10-CM | POA: Diagnosis not present

## 2021-06-05 LAB — CBC WITH DIFFERENTIAL/PLATELET
Abs Immature Granulocytes: 0.02 10*3/uL (ref 0.00–0.07)
Basophils Absolute: 0 10*3/uL (ref 0.0–0.1)
Basophils Relative: 0 %
Eosinophils Absolute: 0.4 10*3/uL (ref 0.0–0.5)
Eosinophils Relative: 5 %
HCT: 40 % (ref 36.0–46.0)
Hemoglobin: 13.7 g/dL (ref 12.0–15.0)
Immature Granulocytes: 0 %
Lymphocytes Relative: 30 %
Lymphs Abs: 2.4 10*3/uL (ref 0.7–4.0)
MCH: 30.2 pg (ref 26.0–34.0)
MCHC: 34.3 g/dL (ref 30.0–36.0)
MCV: 88.1 fL (ref 80.0–100.0)
Monocytes Absolute: 0.6 10*3/uL (ref 0.1–1.0)
Monocytes Relative: 8 %
Neutro Abs: 4.4 10*3/uL (ref 1.7–7.7)
Neutrophils Relative %: 57 %
Platelets: 215 10*3/uL (ref 150–400)
RBC: 4.54 MIL/uL (ref 3.87–5.11)
RDW: 12.3 % (ref 11.5–15.5)
WBC: 7.8 10*3/uL (ref 4.0–10.5)
nRBC: 0 % (ref 0.0–0.2)

## 2021-06-05 LAB — HEPATIC FUNCTION PANEL
ALT: 17 U/L (ref 0–44)
AST: 22 U/L (ref 15–41)
Albumin: 4 g/dL (ref 3.5–5.0)
Alkaline Phosphatase: 61 U/L (ref 38–126)
Bilirubin, Direct: <0.1 mg/dL (ref 0.0–0.2)
Total Bilirubin: 0.4 mg/dL (ref 0.3–1.2)
Total Protein: 7.1 g/dL (ref 6.5–8.1)

## 2021-06-05 LAB — BASIC METABOLIC PANEL
Anion gap: 8 (ref 5–15)
BUN: 13 mg/dL (ref 6–20)
CO2: 26 mmol/L (ref 22–32)
Calcium: 9.2 mg/dL (ref 8.9–10.3)
Chloride: 105 mmol/L (ref 98–111)
Creatinine, Ser: 0.84 mg/dL (ref 0.44–1.00)
GFR, Estimated: >60 mL/min (ref >60)
Glucose, Bld: 111 mg/dL — ABNORMAL HIGH (ref 70–99)
Potassium: 3.9 mmol/L (ref 3.5–5.1)
Sodium: 139 mmol/L (ref 135–145)

## 2021-06-05 LAB — URINALYSIS, ROUTINE W REFLEX MICROSCOPIC
Bilirubin Urine: NEGATIVE
Glucose, UA: NEGATIVE mg/dL
Hgb urine dipstick: NEGATIVE
Ketones, ur: NEGATIVE mg/dL
Leukocytes,Ua: NEGATIVE
Nitrite: NEGATIVE
Protein, ur: NEGATIVE mg/dL
Specific Gravity, Urine: 1.02 (ref 1.005–1.030)
pH: 7 (ref 5.0–8.0)

## 2021-06-05 LAB — LIPASE, BLOOD: Lipase: 29 U/L (ref 11–51)

## 2021-06-05 LAB — I-STAT BETA HCG BLOOD, ED (MC, WL, AP ONLY): I-stat hCG, quantitative: 6.6 m[IU]/mL — ABNORMAL HIGH (ref <5)

## 2021-06-05 LAB — TROPONIN I (HIGH SENSITIVITY)
Troponin I (High Sensitivity): 3 ng/L (ref <18)
Troponin I (High Sensitivity): 4 ng/L (ref <18)

## 2021-06-05 MED ORDER — IOHEXOL 300 MG/ML  SOLN
100.0000 mL | Freq: Once | INTRAMUSCULAR | Status: AC | PRN
  Administered 2021-06-05: 100 mL via INTRAVENOUS

## 2021-06-05 MED ORDER — AMOXICILLIN-POT CLAVULANATE 875-125 MG PO TABS: 1.0000 | ORAL_TABLET | Freq: Two times a day (BID) | ORAL | 0 refills | Status: DC

## 2021-06-05 MED ORDER — SODIUM CHLORIDE 0.9 % IV BOLUS
500.0000 mL | Freq: Once | INTRAVENOUS | Status: AC
  Administered 2021-06-05: 500 mL via INTRAVENOUS

## 2021-06-05 MED ORDER — AMOXICILLIN-POT CLAVULANATE 875-125 MG PO TABS
1.0000 | ORAL_TABLET | Freq: Once | ORAL | Status: AC
Start: 2021-06-05 — End: 2021-06-05
  Administered 2021-06-05: 1 via ORAL
  Filled 2021-06-05: qty 1

## 2021-06-05 MED ORDER — ONDANSETRON 4 MG PO TBDP
4.0000 mg | ORAL_TABLET | Freq: Three times a day (TID) | ORAL | 0 refills | Status: DC | PRN
Start: 2021-06-05 — End: 2022-07-22

## 2021-06-05 NOTE — Discharge Instructions (Addendum)
As we discussed today your CT scan showed some widening of your intestines around the area called the ileum.  Appears to be the same area where you had the obstruction many years ago. I would recommend that you follow-up with your primary care doctor and that you strongly consider seeing a specialist for a colonoscopy. 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  Please do follow-up with your primary care doctor for this.  If you develop constant vomiting, are unable to tolerate food or drink, or not passing gas or having bowel movements, your pain becomes unbearable, you develop fevers or have any new or concerning symptoms please return to the emergency room for repeat evaluation.  Please follow a bland diet, and make sure you are drinking plenty of water.   Please take Ibuprofen (Advil, motrin) and Tylenol (acetaminophen) to relieve your pain.    You may take up to 600 MG (3 pills) of normal strength ibuprofen every 8 hours as needed.   You make take tylenol, up to 1,000 mg (two extra strength pills) every 8 hours as needed.   It is safe to take ibuprofen and tylenol at the same time as they work differently.   Do not take more than 3,000 mg tylenol in a 24 hour period (not more than one dose every 8 hours.  Please check all medication labels as many medications such as pain and cold medications may contain tylenol.  Do not drink alcohol while taking these medications.  Do not take other NSAID'S while taking ibuprofen (such as aleve or naproxen).  Please take ibuprofen with food to decrease stomach upset.  You may have diarrhea from the antibiotics.  It is very important that you continue to take the antibiotics even if you get diarrhea unless a medical professional tells you that you may stop taking them.  If you stop too early the bacteria you are being treated for will become stronger and you may need different, more powerful antibiotics that have more side effects and worsening  diarrhea.  Please stay well hydrated and consider probiotics as they may decrease the severity of your diarrhea.

## 2021-06-05 NOTE — ED Provider Notes (Signed)
[facility] EMERGENCY DEPARTMENT Provider Note   CSN: [id number] Arrival date & time: 06/05/21  1533     History  Chief Complaint  Patient presents with   Abdominal Pain    Michelle Crawford is a 56 y.o. 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 She states that since 6:30 AM this morning she has been having intermittent abdominal pain.  When it started it was every 15 minutes and she would have about 10 to 15 seconds of severe squeezing pain.  And really in between this would relieve and she would be pain-free. She states that throughout the day it has become less frequent and is now about every 30 minutes.  She has not been able to identify any aggravating or alleviating factors.  She is nauseous without vomiting.  Last bowel movement was this morning and was normal for her.  No fevers.  She denies any numbness, weakness, or tingling.  The pain is in the epigastric and periumbilical regions.  No urinary symptoms.  No chest pain, cough, or shortness of breath.   HPI     Home Medications Prior to Admission medications   Medication Sig Start Date End Date Taking? Authorizing Provider  albuterol (VENTOLIN HFA) 108 (90 Base) MCG/ACT inhaler Can inhale two puffs every four to six hours as needed for cough or wheeze. Patient taking differently: 2 puffs every 6 (six) hours as needed for shortness of breath. 02/25/21  Yes Kozlow, Donnamarie Poag, MD  amoxicillin-clavulanate (AUGMENTIN) 875-125 MG tablet Take 1 tablet by mouth every 12 (twelve) hours. 06/05/21  Yes Lorin Glass, PA-C  Fluoxetine HCl, PMDD, 10 MG TABS Take 1 tablet by mouth daily. 12/10/19  Yes [provider]  montelukast (SINGULAIR) 10 MG tablet TAKE 1 TABLET BY MOUTH ONCE DAILY AS DIRECTED Patient taking differently: Take 10 mg by mouth at bedtime. 02/25/21   Yes Kozlow, Donnamarie Poag, MD  ondansetron (ZOFRAN-ODT) 4 MG disintegrating tablet Take 1 tablet (4 mg total) by mouth every 8 (eight) hours as needed for nausea. 06/05/21  Yes Lorin Glass, PA-C  olopatadine (PATANOL) 0.1 % ophthalmic solution INSTILL 1 DROP INTO EACH EYE TWICE DAILY AS NEEDED Patient not taking: Reported on 06/05/2021 01/28/20   Jiles Prows, MD      Allergies    Shellfish allergy and Morphine and related    Review of Systems   Review of Systems  Constitutional:  Negative for chills and fever.  HENT:  Negative for congestion.   Respiratory:  Negative for cough and shortness of breath.   Cardiovascular:  Negative for chest pain, palpitations and leg swelling.  Gastrointestinal:  Positive for abdominal pain and nausea. Negative for constipation, diarrhea and vomiting.  Musculoskeletal:  Negative for back pain and neck pain.  Skin:  Negative for color change and rash.  Neurological:  Negative for weakness and headaches.  All other systems reviewed and are negative.  Physical Exam Updated Vital Signs BP 135/70    Pulse 65    Temp 98.4 F (36.9 C) (Oral)    Resp 16    SpO2 98%  Physical Exam Vitals and nursing note reviewed.  Constitutional:      General: She is not in acute distress.    Appearance: She is not diaphoretic.  HENT:     Head: Normocephalic and atraumatic.  Eyes:  General: No scleral icterus.       Right eye: No discharge.        Left eye: No discharge.     Conjunctiva/sclera: Conjunctivae normal.  Cardiovascular:     Rate and Rhythm: Normal rate and regular rhythm.  Pulmonary:     Effort: Pulmonary effort is normal. No respiratory distress.     Breath sounds: No stridor.  Abdominal:     General: Abdomen is flat. Bowel sounds are normal. There is no distension.     Palpations: Abdomen is soft.     Tenderness: There is abdominal tenderness in the epigastric area and periumbilical area. There is no right CVA tenderness, left CVA tenderness,  guarding or rebound.     Hernia: No hernia is present.  Musculoskeletal:        General: No deformity.     Cervical back: Normal range of motion.  Skin:    General: Skin is warm and dry.  Neurological:     General: No focal deficit present.     Mental Status: She is alert.     Motor: No abnormal muscle tone.  Psychiatric:        Mood and Affect: Mood normal.        Behavior: Behavior normal.    ED Results / Procedures / Treatments   Labs (all labs ordered are listed, but only abnormal results are displayed) Labs Reviewed  BASIC METABOLIC PANEL - Abnormal; Notable for the following components:      Result Value   Glucose, Bld 111 (*)    All other components within normal limits  I-STAT BETA HCG BLOOD, ED ([facility], [facility], [facility] ONLY) - Abnormal; Notable for the following components:   I-stat hCG, quantitative 6.6 (*)    All other components within normal limits  URINALYSIS, ROUTINE W REFLEX MICROSCOPIC  CBC WITH DIFFERENTIAL/PLATELET  LIPASE, BLOOD  HEPATIC FUNCTION PANEL  TROPONIN I (HIGH SENSITIVITY)  TROPONIN I (HIGH SENSITIVITY)    EKG None  Radiology CT ABDOMEN PELVIS W CONTRAST  Result Date: 06/05/2021 CLINICAL DATA:  Epigastric pain radiating to umbilical region for 1 day EXAM: CT ABDOMEN AND PELVIS WITH CONTRAST TECHNIQUE: Multidetector CT imaging of the abdomen and pelvis was performed using the standard protocol following bolus administration of intravenous contrast. RADIATION DOSE REDUCTION: This exam was performed according to the departmental dose-optimization program which includes automated exposure control, adjustment of the mA and/or kV according to patient size and/or use of iterative reconstruction technique. CONTRAST:  153mL OMNIPAQUE IOHEXOL 300 MG/ML  SOLN COMPARISON:  02/06/2018 FINDINGS: Lower chest: No acute pleural or parenchymal lung disease. Hepatobiliary: Multiple liver hypodensities, largest measuring 1.7 cm, compatible with cysts. No intrahepatic duct  dilation. The gallbladder is unremarkable. Pancreas: Unremarkable. No pancreatic ductal dilatation or surrounding inflammatory changes. Spleen: Normal in size without focal abnormality. Adrenals/Urinary Tract: Adrenal glands are unremarkable. Kidneys are normal, without renal calculi, focal lesion, or hydronephrosis. Bladder is unremarkable. Stomach/Bowel: There is mild dilation of the distal ileum measuring up to 3.3 cm. Mild associated wall thickening, which may reflect underlying inflammatory or infectious enteritis. No high-grade bowel obstruction. Normal appendix right lower quadrant. Vascular/Lymphatic: No significant vascular findings are present. No enlarged abdominal or pelvic lymph nodes. Reproductive: Uterus and bilateral adnexa are unremarkable. Other: Trace free fluid within the right lower quadrant and right hemipelvis, nonspecific. No free intraperitoneal gas. No abdominal wall hernia. Musculoskeletal: No acute or destructive bony lesions. Reconstructed images demonstrate no additional findings. IMPRESSION: 1. Segmental dilation of the  distal ileum, with mild mural thickening, compatible with inflammatory or infectious ileitis. No high-grade bowel obstruction. 2. Trace free fluid within the right lower quadrant, either physiologic or reactive free fluid due to the inflammatory process involving the terminal ileum described above. 3. Normal appendix. Electronically Signed   By: Randa Ngo M.D.   On: 06/05/2021 22:25    Procedures Procedures    Medications Ordered in ED Medications  sodium chloride 0.9 % bolus 500 mL (0 mLs Intravenous Stopped 06/05/21 2230)  iohexol (OMNIPAQUE) 300 MG/ML solution 100 mL (100 mLs Intravenous Contrast Given 06/05/21 2214)  amoxicillin-clavulanate (AUGMENTIN) 875-125 MG per tablet 1 tablet (1 tablet Oral Given 06/05/21 2326)    ED Course/ Medical Decision Making/ A&P Clinical Course as of 06/05/21 2342  Sat Jun 05, 2021  1805 I-stat hCG, quantitative(!):  6.6 Patient is postmenopausal, she is not pregnant.  [EH]    Clinical Course User Index [EH] Lorin Glass, PA-C                           Medical Decision Making  Patient is a 56 year old woman who presents today for evaluation of abdominal pain with nausea.  That started this morning.  She has a history of small bowel obstruction in 2007 and states that this feels somewhat similar.  Labs are ordered, reviewed viewed and interpreted by me, CBC is unremarkable.  BMP and hepatic function panel without significant hematologic or electrolyte derangements.  Lipase is not elevated and troponin is nonelevated.  UA without evidence of infection.  EKG is ordered and evaluated by me, normal sinus rhythm without evidence of ischemia.  CT abdomen pelvis is obtained.  I independently reviewed the images.  CT abdomen pelvis is significant for segmental dilation of the distal ileum with wall thickening and no bowel obstruction.  There is trace free fluid in the right lower quadrant which is felt to either be physiologic or reactive without evidence of perforation or abscess formation.  I discussed these results with the patient.  Clinically she is not obstructed as she has tolerated p.o. despite nausea and had a bowel movement this morning. We discussed that there is possibility that this may develop into a bowel obstruction.  She is aware of this. Hospitalization is considered, however as patient is not currently obstructed, is not in uncontrolled pain in fact has declined pain medication and does not wish for any narcotic pain medicine for at home is afebrile and generally well-appearing it is not currently indicated. She is aware that this may become indicated if this progresses. Recommended bland diet and liquid diet and advancing diet as tolerated. She was given IV fluids while in the emergency room without significant change in her condition.  He was offered both analgesics and antiemetics  which she declined. Due to possibility of infectious versus inflammatory colitis she is started on Augmentin.  Prescription given along with prescription for Zofran. She has not had a colonoscopy before, I strongly recommended that she follow-up with her primary care doctor regarding all incidental findings and strongly consider having a GI consult for consideration of colonoscopy.  She states her understanding of these instructions.  Return precautions were discussed with patient who states their understanding.  At the time of discharge patient denied any unaddressed complaints or concerns.  Patient is agreeable for discharge home.  Note: Portions of this report may have been transcribed using voice recognition software. Every effort was made to ensure  accuracy; however, inadvertent computerized transcription errors may be present  Patient's care was discussed with husband at bedside with patient's permission. Patient does not have any significant social determinants affecting her healthcare that were identified today.   Final Clinical Impression(s) / ED Diagnoses Final diagnoses:  Abdominal pain, unspecified abdominal location  Abnormal computed tomography of cecum and terminal ileum  Liver cyst    Rx / DC Orders ED Discharge Orders          Ordered    amoxicillin-clavulanate (AUGMENTIN) 875-125 MG tablet  Every 12 hours        06/05/21 2303    ondansetron (ZOFRAN-ODT) 4 MG disintegrating tablet  Every 8 hours PRN        06/05/21 2303              Lorin Glass, PA-C 06/05/21 2359    Dorie Rank, MD 06/06/21 1512

## 2021-06-05 NOTE — ED Notes (Signed)
Patient transported to CT 

## 2021-06-05 NOTE — ED Triage Notes (Signed)
Pt reports upper abd pain since 5am with nausea. Normal BM this morning.  Denies vomiting.

## 2022-07-22 ENCOUNTER — Ambulatory Visit: Payer: Self-pay | Admitting: Internal Medicine

## 2022-07-22 ENCOUNTER — Encounter: Payer: Self-pay | Admitting: Internal Medicine

## 2022-07-22 VITALS — BP 126/70 | HR 70 | Resp 16 | Ht 64.5 in | Wt 177.0 lb

## 2022-07-22 DIAGNOSIS — J4530 Mild persistent asthma, uncomplicated: Secondary | ICD-10-CM | POA: Insufficient documentation

## 2022-07-22 DIAGNOSIS — H1010 Acute atopic conjunctivitis, unspecified eye: Secondary | ICD-10-CM | POA: Insufficient documentation

## 2022-07-22 DIAGNOSIS — Z91013 Allergy to seafood: Secondary | ICD-10-CM

## 2022-07-22 DIAGNOSIS — H1013 Acute atopic conjunctivitis, bilateral: Secondary | ICD-10-CM

## 2022-07-22 DIAGNOSIS — J3089 Other allergic rhinitis: Secondary | ICD-10-CM

## 2022-07-22 DIAGNOSIS — G43909 Migraine, unspecified, not intractable, without status migrainosus: Secondary | ICD-10-CM | POA: Insufficient documentation

## 2022-07-22 MED ORDER — MONTELUKAST SODIUM 10 MG PO TABS: ORAL_TABLET | ORAL | 5 refills | Status: DC

## 2022-07-22 MED ORDER — ALBUTEROL SULFATE HFA 108 (90 BASE) MCG/ACT IN AERS: INHALATION_SPRAY | RESPIRATORY_TRACT | 1 refills | Status: DC

## 2022-07-22 MED ORDER — EPINEPHRINE 0.3 MG/0.3ML IJ SOAJ
0.3000 mg | INTRAMUSCULAR | 1 refills | Status: AC | PRN
Start: 2022-07-22 — End: ?

## 2022-07-22 MED ORDER — ASMANEX HFA 200 MCG/ACT IN AERO: INHALATION_SPRAY | RESPIRATORY_TRACT | 5 refills | Status: DC

## 2022-07-22 NOTE — Progress Notes (Signed)
Follow Up Note  RE: Michelle Crawford MRN: [id number] DOB: 08-21-1965 Date of Office Visit: 07/22/2022  Referring provider: Algis Greenhouse, MD Primary care provider: Algis Greenhouse, MD  Chief Complaint: Allergies and Asthma  History of Present Illness: I had the pleasure of seeing Michelle Crawford for a follow up visit at the [name] on 07/22/2022. She is a 57 y.o. female, who is being followed for allergic rhinitis, shellfish allergy, intermittent asthma, migraines. Her previous allergy office visit was on 02/25/21 with Dr. Neldon Mc. Today is a regular follow up visit.  History obtained from patient, chart review.  Since last visit she has received 3 rounds of antibiotics, 2 courses of OCS for persistent URI symptoms and cough.  Given persistent cough she was started on asmanex 254mg 1 puff twice daily about 4 weeks ago.  She reports good response to asthmanex with cough, however she ran out of her montelukast 4 weeks ago.  Since then she has had increased rhinnorhea and nasal cogestion  She is not using nasacort or antihistamines daily.    She does have rescue albuterol but has not needed it since starting asmanex. Previously using multiple times a day.   She had cataract surgery yesterday and was told to reduce patanol use to every other day.  Has had increase ocular itching today.   She continues to avoid shellfish.  No accidental reactions or ingestions.  Her epipen is expired.   Headaches have improved with caffiene reduction.      Assessment and Plan: Michelle Crawford a 57y.o. female with: Perennial allergic rhinitis  Not well controlled mild persistent asthma - Plan: Spirometry with Graph  Shellfish allergy  Seasonal allergic conjunctivitis  Migraine syndrome   Plan: Patient Instructions  Breathing tests showed some inflammation in you lungs that was reversible Since you have been off your medications, lets focus on restarting the rhinitis  management plan and recheck breathing in 4 weeks   1.  Continue to perform allergy avoidance measures to: grasses, weeds, trees, dust mite, cat, dog, and cockroach AND Shellfish   2.  Continue to Treat and prevent inflammation:   A.  OTC Nasacort 1 spray each nostril 7 times per week  B.  Montelukast 10 mg tablet 1 time per day  C.  Asmanex 2066m 1 puff twice a day (rinse mouth after use)  D. OTC antihistamine   3.  Continue to Treat and prevent headache:   A.  Consolidate caffeine consumption  4.  If needed:   A.  Nasal saline  B.  Patanol -1 drop each eye twice a day  C.  Pro-air HFA 2 puffs every 4-6 hours  D.  Auvi-Q 0.3, Benadryl, MD/ER evaluation for allergic reaction   6. Return to clinic in 4 weeks   Thank you so much for letting me partake in your care today.  Don't hesitate to reach out if you have any additional concerns!  EvRoney MarionMD  [facility] [place]     Meds ordered this encounter  Medications   ASMANEX HFA 200 MCG/ACT AERO    Sig: 1 puff twice daily. Rinse mouth after use.    Dispense:  13 g    Refill:  5   montelukast (SINGULAIR) 10 MG tablet    Sig: TAKE 1 TABLET BY MOUTH ONCE DAILY AS DIRECTED    Dispense:  30 tablet    Refill:  5   albuterol (VENTOLIN  HFA) 108 (90 Base) MCG/ACT inhaler    Sig: 2 puffs every 4-6 hours as needed    Dispense:  18 g    Refill:  1   EPINEPHrine 0.3 mg/0.3 mL IJ SOAJ injection    Sig: Inject 0.3 mg into the muscle as needed for anaphylaxis. As needed for life-threatening allergic reactions    Dispense:  2 each    Refill:  1    Lab Orders  No laboratory test(s) ordered today   Diagnostics: Spirometry:  Tracings reviewed. Her effort: Good reproducible efforts. FVC: 2.38L FEV1: 1.75L, 66% predicted FEV1/FVC ratio: 74% Interpretation: Spirometry consistent with mixed obstructive and restrictive disease. After 4 puffs of albuterol FVC increased by  515m and 23%, FEV1 increased  by 5767mand 32%.  This is a significant post- bronchodilator response  Please see scanned spirometry results for details.  Results interpreted by myself during this encounter and discussed with patient/family.   Medication List:  Current Outpatient Medications  Medication Sig Dispense Refill   EPINEPHrine 0.3 mg/0.3 mL IJ SOAJ injection Inject 0.3 mg into the muscle as needed for anaphylaxis. As needed for life-threatening allergic reactions 2 each 1   FLUoxetine (PROZAC) 40 MG capsule Take 40 mg by mouth daily.     Fluoxetine HCl, PMDD, 10 MG TABS Take 1 tablet by mouth daily.     albuterol (VENTOLIN HFA) 108 (90 Base) MCG/ACT inhaler 2 puffs every 4-6 hours as needed 18 g 1   ASMANEX HFA 200 MCG/ACT AERO 1 puff twice daily. Rinse mouth after use. 13 g 5   montelukast (SINGULAIR) 10 MG tablet TAKE 1 TABLET BY MOUTH ONCE DAILY AS DIRECTED 30 tablet 5   No current facility-administered medications for this visit.   Allergies: Allergies  Allergen Reactions   Shellfish Allergy Anaphylaxis   Morphine And Related Nausea And Vomiting   I reviewed her past medical history, social history, family history, and environmental history and no significant changes have been reported from her previous visit.  ROS: All others negative except as noted per HPI.   Objective: BP 126/70   Pulse 70   Resp 16   Ht 5' 4.5" (1.638 m)   Wt 177 lb (80.3 kg)   SpO2 98%   BMI 29.91 kg/m  Body mass index is 29.91 kg/m. General Appearance:  Alert, cooperative, no distress, appears stated age  Head:  Normocephalic, without obvious abnormality, atraumatic  Eyes:  Conjunctiva slightly erythematous , EOM's intact  Nose: Nares normal,  erythematous and edematous nasal mucosa with copious clear rhinnorhea, hypertrophic turbinates, no visible anterior polyps, and septum midline  Throat: Lips, tongue normal; teeth and gums normal, + cobblestoning  Neck: Supple, symmetrical  Lungs:   clear to auscultation  bilaterally, Respirations unlabored, no coughing  Heart:  regular rate and rhythm and no murmur, Appears well perfused  Extremities: No edema  Skin: Skin color, texture, turgor normal, no rashes or lesions on visualized portions of skin  Neurologic: No gross deficits   Previous notes and tests were reviewed. The plan was reviewed with the patient/family, and all questions/concerned were addressed.  It was my pleasure to see KrNikkoloday and participate in her care. Please feel free to contact me with any questions or concerns.  Sincerely,  EvRoney MarionMD  Allergy & Immunology  [facility] LLCffice: [phone]

## 2022-07-22 NOTE — Patient Instructions (Addendum)
Breathing tests showed some inflammation in you lungs that was reversible Since you have been off your medications, lets focus on restarting the rhinitis management plan and recheck breathing in 4 weeks   1.  Continue to perform allergy avoidance measures to: grasses, weeds, trees, dust mite, cat, dog, and cockroach AND Shellfish   2.  Continue to Treat and prevent inflammation:   A.  OTC Nasacort 1 spray each nostril 7 times per week  B.  Montelukast 10 mg tablet 1 time per day  C.  Asmanex 285mg 1 puff twice a day (rinse mouth after use)  D. OTC antihistamine   3.  Continue to Treat and prevent headache:   A.  Consolidate caffeine consumption  4.  If needed:   A.  Nasal saline  B.  Patanol -1 drop each eye twice a day  C.  Pro-air HFA 2 puffs every 4-6 hours  D.  Auvi-Q 0.3, Benadryl, MD/ER evaluation for allergic reaction   6. Return to clinic in 4 weeks   Thank you so much for letting me partake in your care today.  Don't hesitate to reach out if you have any additional concerns!  ERoney Marion MD  [facility] [place]

## 2022-09-02 ENCOUNTER — Ambulatory Visit: Payer: BLUE CROSS/BLUE SHIELD | Admitting: Internal Medicine

## 2022-11-20 IMAGING — CT CT ABD-PELV W/ CM
2 of 5 series · 16 of 46 positions shown, 18 images · IV contrast (APPLIED)
Comparison: 02/06/2018

CLINICAL DATA: Epigastric pain radiating to umbilical region for 1
day

EXAM:
CT ABDOMEN AND PELVIS WITH CONTRAST
TECHNIQUE: Multidetector CT imaging of the abdomen and pelvis was performed
using the standard protocol following bolus administration of
intravenous contrast.

[Series 3: abdomen 5.0 · axial · 0.81mm/px · z∈[-670,-285]mm · 13 of 91 slices shown, 15 images]
[im 7/91  soft-tissue]
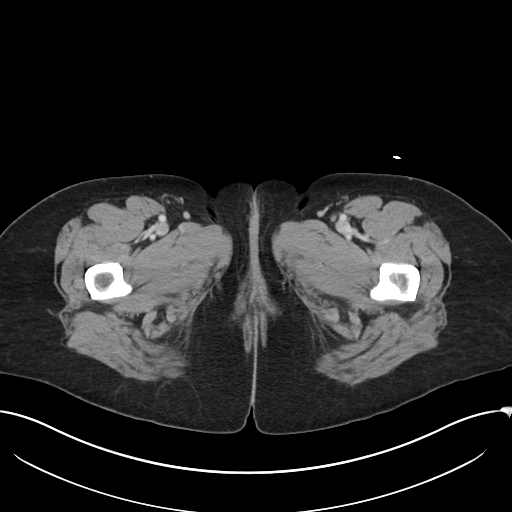
[im 7/91  bone]
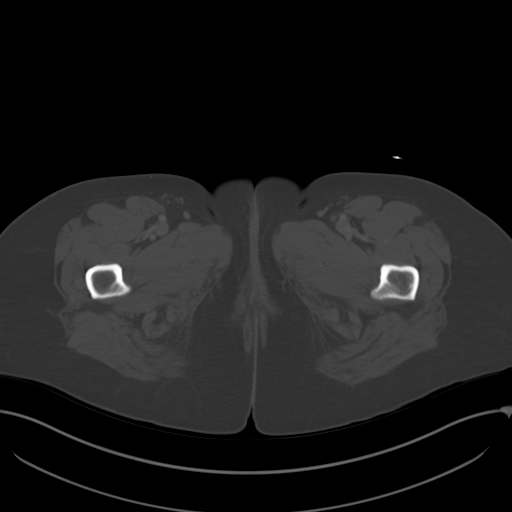
[im 13/91  soft-tissue]
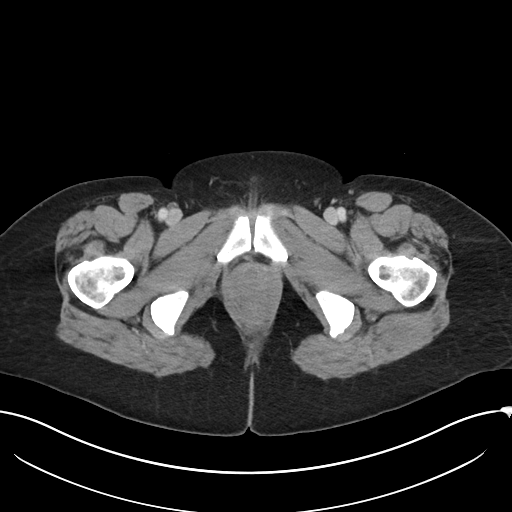
[im 20/91  soft-tissue]
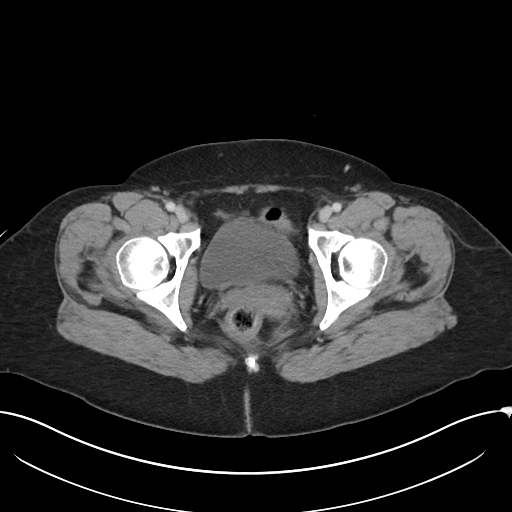
[im 26/91  soft-tissue]
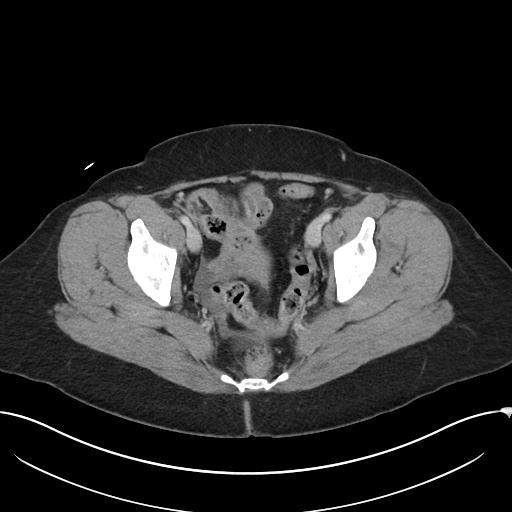
[im 33/91  soft-tissue]
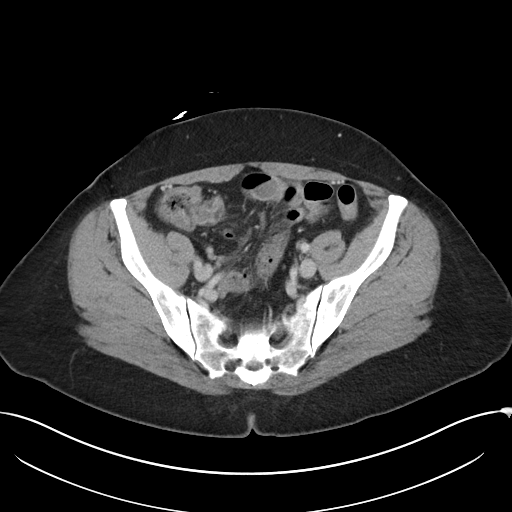
[im 39/91  soft-tissue]
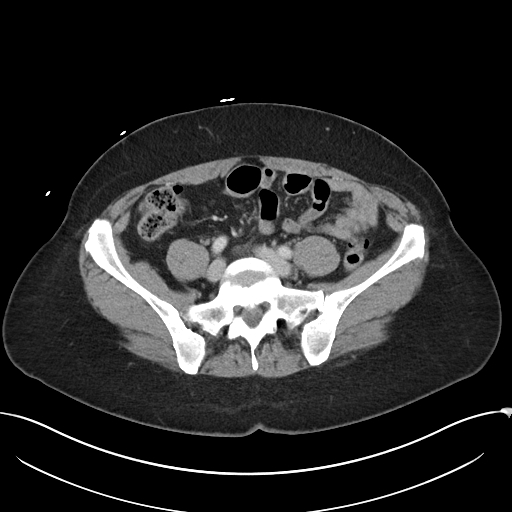
[im 46/91  soft-tissue]
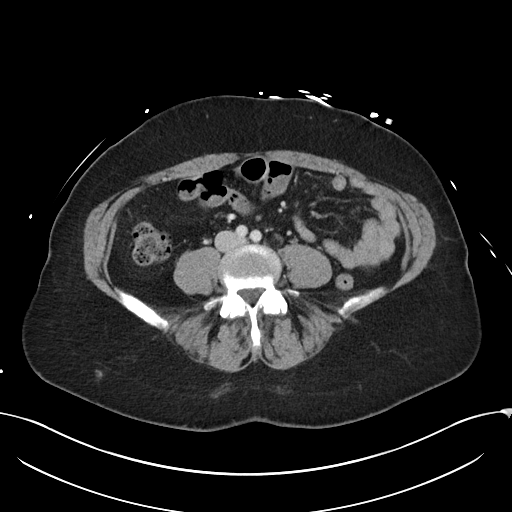
[im 52/91  soft-tissue]
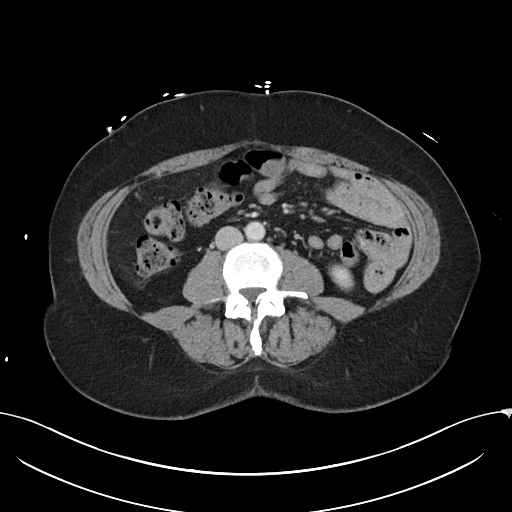
[im 58/91  soft-tissue]
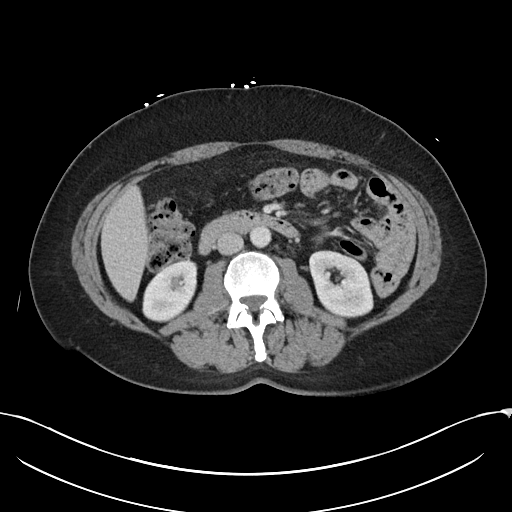
[im 58/91  bone]
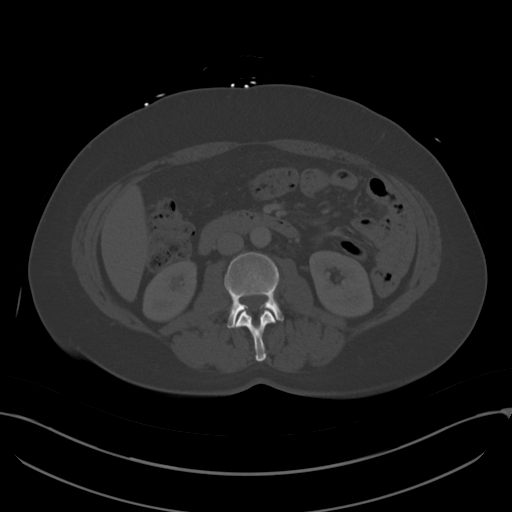
[im 65/91  soft-tissue]
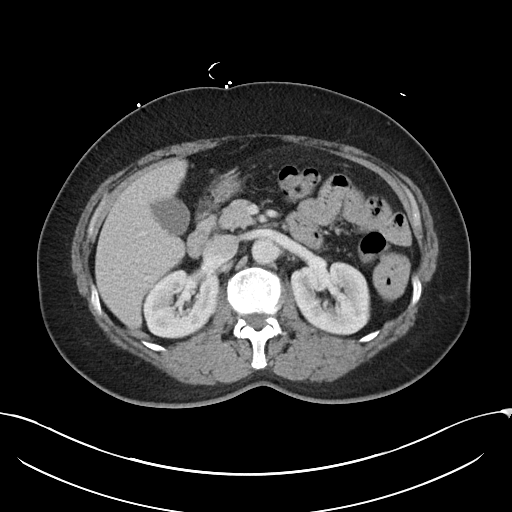
[im 71/91  soft-tissue]
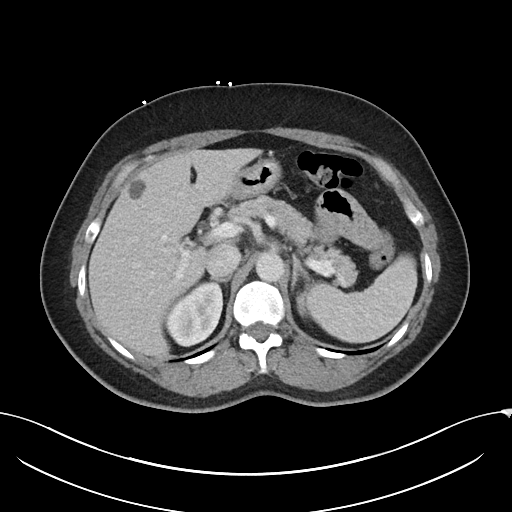
[im 78/91  soft-tissue]
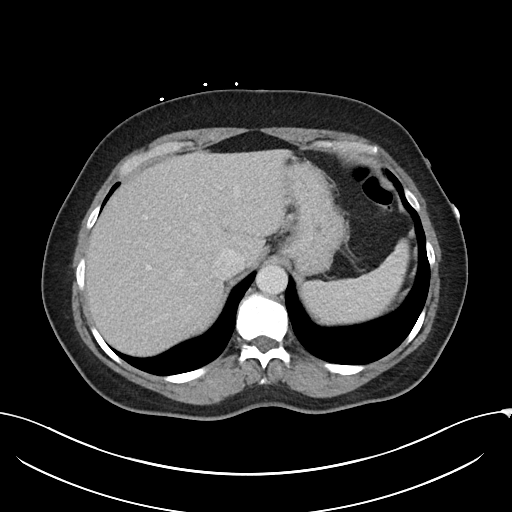
[im 84/91  soft-tissue]
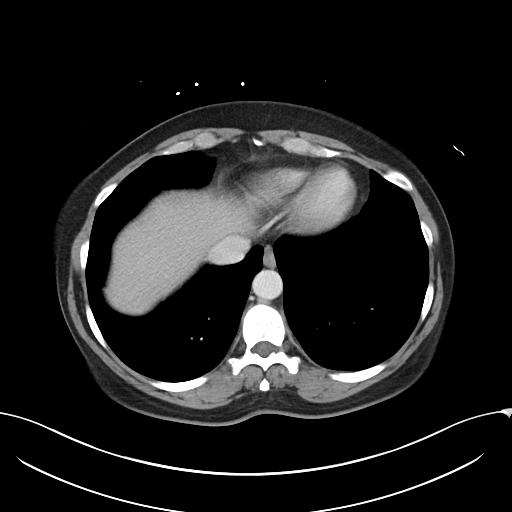

[Series 7: abdomen 3.0 mpr cor · coronal · 0.90mm/px · 3 of 98 slices shown]
[im 33/98  soft-tissue]
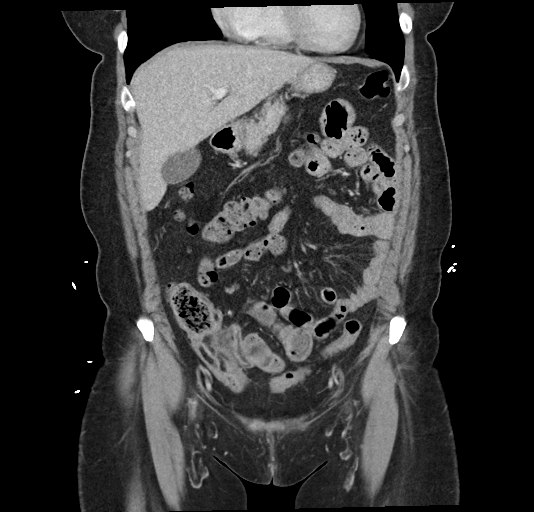
[im 44/98  soft-tissue]
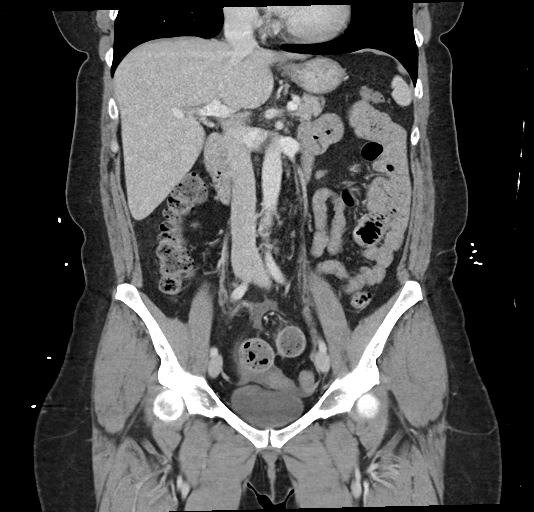
[im 54/98  soft-tissue]
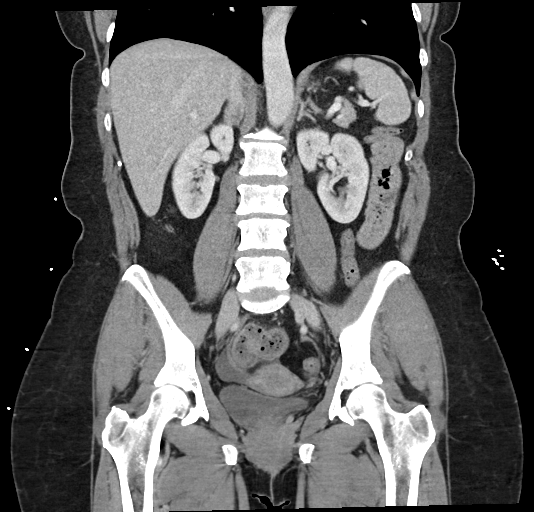

[16 of 46 positions shown; findings below may reference images not displayed]

RADIATION DOSE REDUCTION: This exam was performed according to the
departmental dose-optimization program which includes automated
exposure control, adjustment of the mA and/or kV according to
patient size and/or use of iterative reconstruction technique.

CONTRAST:  100mL OMNIPAQUE IOHEXOL 300 MG/ML  SOLN
FINDINGS: Lower chest: No acute pleural or parenchymal lung disease.

Hepatobiliary: Multiple liver hypodensities, largest measuring
cm, compatible with cysts. No intrahepatic duct dilation. The
gallbladder is unremarkable.

Pancreas: Unremarkable. No pancreatic ductal dilatation or
surrounding inflammatory changes.

Spleen: Normal in size without focal abnormality.

Adrenals/Urinary Tract: Adrenal glands are unremarkable. Kidneys are
normal, without renal calculi, focal lesion, or hydronephrosis.
Bladder is unremarkable.

Stomach/Bowel: There is mild dilation of the distal ileum measuring
up to 3.3 cm. Mild associated wall thickening, which may reflect
underlying inflammatory or infectious enteritis. No high-grade bowel
obstruction. Normal appendix right lower quadrant.

Vascular/Lymphatic: No significant vascular findings are present. No
enlarged abdominal or pelvic lymph nodes.

Reproductive: Uterus and bilateral adnexa are unremarkable.

Other: Trace free fluid within the right lower quadrant and right
hemipelvis, nonspecific. No free intraperitoneal gas. No abdominal
wall hernia.

Musculoskeletal: No acute or destructive bony lesions. Reconstructed
images demonstrate no additional findings.
IMPRESSION: 1. Segmental dilation of the distal ileum, with mild mural
thickening, compatible with inflammatory or infectious ileitis. No
high-grade bowel obstruction.
2. Trace free fluid within the right lower quadrant, either
physiologic or reactive free fluid due to the inflammatory process
involving the terminal ileum described above.
3. Normal appendix.

## 2023-01-20 ENCOUNTER — Other Ambulatory Visit: Payer: Self-pay | Admitting: Internal Medicine

## 2023-05-09 ENCOUNTER — Other Ambulatory Visit: Payer: Self-pay | Admitting: Internal Medicine

## 2023-09-02 ENCOUNTER — Other Ambulatory Visit: Payer: Self-pay | Admitting: Internal Medicine

## 2023-10-01 ENCOUNTER — Other Ambulatory Visit: Payer: Self-pay | Admitting: Internal Medicine

## 2023-11-13 LAB — COLOGUARD: COLOGUARD: NEGATIVE

## 2024-06-07 ENCOUNTER — Encounter: Payer: Self-pay | Admitting: Internal Medicine

## 2024-06-07 ENCOUNTER — Ambulatory Visit: Payer: Self-pay | Admitting: Internal Medicine

## 2024-06-07 VITALS — BP 118/76 | HR 65 | Resp 16 | Ht 64.5 in | Wt 178.6 lb

## 2024-06-07 DIAGNOSIS — Z91013 Allergy to seafood: Secondary | ICD-10-CM | POA: Diagnosis not present

## 2024-06-07 DIAGNOSIS — J3089 Other allergic rhinitis: Secondary | ICD-10-CM

## 2024-06-07 DIAGNOSIS — H1010 Acute atopic conjunctivitis, unspecified eye: Secondary | ICD-10-CM

## 2024-06-07 DIAGNOSIS — J4530 Mild persistent asthma, uncomplicated: Secondary | ICD-10-CM | POA: Diagnosis not present

## 2024-06-07 MED ORDER — MONTELUKAST SODIUM 10 MG PO TABS: 10.0000 mg | ORAL_TABLET | Freq: Every day | ORAL | 5 refills | Status: AC

## 2024-06-07 NOTE — Progress Notes (Signed)
 "  Follow Up Note  RE: Michelle Crawford MRN: [id number] DOB: [birth year] Date of Office Visit: 06/07/2024  Referring provider: Ofilia Lamar CROME, MD Primary care provider: Ofilia Lamar CROME, MD  Chief Complaint: Allergies, Asthma, and Shellfish Allergy  History of Present Illness: I had the pleasure of seeing Michelle Crawford for a follow up visit at the [facility] on 06/07/2024. She is a 59 y.o. female, who is being followed for allergic rhinitis, shellfish allergy, intermittent asthma, migraines. Her previous allergy office visit was on 07/22/22 with Dr. Lorin. Today is a acute visit for allergy flare .  History obtained from patient, chart review.  Discussed the use of AI scribe software for clinical note transcription with the patient, who gave verbal consent to proceed.  History of Present Illness Michelle Crawford is a 59 year old female who presents with worsening allergy symptoms.  Allergic rhinitis and ocular symptoms - Worsening allergy symptoms over the past couple of weeks, including nasal congestion, sore throat, and watery, puffy, red eyes. - Uses Nasacort nasal spray and has used it recently. - Does not regularly take an antihistamine. - Uses eye drops as needed, but not on a regular schedule. - Has not been using sinus rinses.  Environmental allergens and exposures - Allergic to her dog, which has had a skin condition for several months, which has resulted in increased shedding  - Dog sleeps in her bed. - Has not implemented dust mite precautions with her new reclining bed, which may be affecting her allergies.  Medication adherence and management - Has run out of montelukast , which she uses to manage her allergy symptoms. - Uses Airsupra primarily during exercise, which helps manage her symptoms.  Still avoiding shellfish. No accidental reactions or ingestions    Assessment and Plan: Michelle Crawford is a 59 y.o. female with: Perennial allergic  rhinitis  Seasonal allergic conjunctivitis  Shellfish allergy  Mild persistent asthma without complication   Plan: Patient Instructions  For Acute Flare: Prednisone 10mg  twice daily for 5 days   1.  Continue to perform allergy avoidance measures to: grasses, weeds, trees, dust mite, cat, dog, and cockroach AND Shellfish   2.  Continue to Treat and prevent inflammation:   A.  OTC Nasacort 1 spray each nostril 7 times per week  B.  Montelukast  10 mg tablet 1 time per day  C.  Sinus Rinses daily 10-15 minutes prior to nose sprays   D. OTC antihistamine - can buy cheaply from amazon, [facility], [facility] and generic is fine    3.  If needed:   A.  Nasal saline  B.  Pataday  -1 drop each eye twice a day  C.  Pro-air HFA 2 puffs every 4-6 hours  D.  Auvi-Q  0.3, Benadryl, MD/ER evaluation for allergic reaction   4. Return to clinic 6 months   Thank you so much for letting me partake in your care today.  Don't hesitate to reach out if you have any additional concerns!  Hargis Lorin, MD  [facility] [place] [place]  Reducing Pollen Exposure  The American Academy of Allergy, Asthma and Immunology suggests the following steps to reduce your exposure to pollen during allergy seasons.    Do not hang sheets or clothing out to dry; pollen may collect on these items. Do not mow lawns or spend time around freshly cut grass; mowing stirs up pollen. Keep windows closed at night.  Keep car windows closed while driving.  Minimize morning activities outdoors, a time when pollen counts are usually at their highest. Stay indoors as much as possible when pollen counts or humidity is high and on windy days when pollen tends to remain in the air longer. Use air conditioning when possible.  Many air conditioners have filters that trap the pollen spores. Use a HEPA room air filter to remove pollen form the indoor air you breathe.  Control of Dog or Cat Allergen  Avoidance is the  best way to manage a dog or cat allergy. If you have a dog or cat and are allergic to dog or cats, consider removing the dog or cat from the home. If you have a dog or cat but dont want to find it a new home, or if your family wants a pet even though someone in the household is allergic, here are some strategies that may help keep symptoms at bay:  Keep the pet out of your bedroom and restrict it to only a few rooms. Be advised that keeping the dog or cat in only one room will not limit the allergens to that room. Dont pet, hug or kiss the dog or cat; if you do, wash your hands with soap and water. High-efficiency particulate air (HEPA) cleaners run continuously in a bedroom or living room can reduce allergen levels over time. Regular use of a high-efficiency vacuum cleaner or a central vacuum can reduce allergen levels. Giving your dog or cat a bath at least once a week can reduce airborne allergen.  DUST MITE AVOIDANCE MEASURES:  There are three main measures that need and can be taken to avoid house dust mites:  Reduce accumulation of dust in general -reduce furniture, clothing, carpeting, books, stuffed animals, especially in bedroom  Separate yourself from the dust -use pillow and mattress encasements (can be found at stores such as [employer] or online) -avoid direct exposure to air condition flow -use a HEPA filter device, especially in the bedroom; you can also use a HEPA filter vacuum cleaner -wipe dust with a moist towel instead of a dry towel or broom when cleaning  Decrease mites and/or their secretions -wash clothing and linen and stuffed animals at highest temperature possible, at least every 2 weeks -stuffed animals can also be placed in a bag and put in a freezer overnight  Despite the above measures, it is impossible to eliminate dust mites or their allergen completely from your home.  With the above measures the burden of mites in your home can be diminished,  with the goal of minimizing your allergic symptoms.  Success will be reached only when implementing and using all means together.  Control of Cockroach Allergen  Cockroach allergen has been identified as an important cause of acute attacks of asthma, especially in urban settings.  There are fifty-five species of cockroach that exist in the [place] , however only three, the American, German and Oriental species produce allergen that can affect patients with Asthma.  Allergens can be obtained from fecal particles, egg casings and secretions from cockroaches.    Remove food sources. Reduce access to water. Seal access and entry points. Spray runways with 0.5-1% Diazinon or Chlorpyrifos Blow boric acid power under stoves and refrigerator. Place bait stations (hydramethylnon) at feeding sites.     Meds ordered this encounter  Medications   montelukast  (SINGULAIR ) 10 MG tablet    Sig: Take 1 tablet (10 mg total) by mouth daily. as directed    Dispense:  30 tablet    Refill:  5    Lab Orders  No laboratory test(s) ordered today   Diagnostics: None done   Medication List:  Current Outpatient Medications  Medication Sig Dispense Refill   AIRSUPRA 90-80 MCG/ACT AERO INHALE 2 PUFFS INTO LUNGS EVERY 6 HOURS AS NEEDED     EPINEPHrine  0.3 mg/0.3 mL IJ SOAJ injection Inject 0.3 mg into the muscle as needed for anaphylaxis. As needed for life-threatening allergic reactions 2 each 1   FLUoxetine (PROZAC) 40 MG capsule Take 40 mg by mouth daily.     Fluoxetine HCl, PMDD, 10 MG TABS Take 1 tablet by mouth daily.     montelukast  (SINGULAIR ) 10 MG tablet Take 1 tablet (10 mg total) by mouth daily. as directed 30 tablet 5   No current facility-administered medications for this visit.   Allergies: Allergies  Allergen Reactions   Shellfish Allergy Anaphylaxis   Morphine And Codeine Nausea And Vomiting   I reviewed her past medical history, social history, family history, and environmental  history and no significant changes have been reported from her previous visit.  ROS: All others negative except as noted per HPI.   Objective: BP 118/76   Pulse 65   Resp 16   Ht 5' 4.5 (1.638 m)   Wt 178 lb 9.6 oz (81 kg)   SpO2 98%   BMI 30.18 kg/m  Body mass index is 30.18 kg/m. General Appearance:  Alert, cooperative, no distress, appears stated age  Head:  Normocephalic, without obvious abnormality, atraumatic  Eyes:  Conjunctiva  erythematous  R>L, EOM's intact  Nose: Nares normal, erythematous and edematous nasal mucosa with copious clear rhinnorhea, hypertrophic turbinates, no visible anterior polyps, and septum midline  Throat: Lips, tongue normal; teeth and gums normal, + cobblestoning  Neck: Supple, symmetrical  Lungs:   clear to auscultation bilaterally, Respirations unlabored, no coughing  Heart:  regular rate and rhythm and no murmur, Appears well perfused  Extremities: No edema  Skin: Skin color, texture, turgor normal, no rashes or lesions on visualized portions of skin  Neurologic: No gross deficits   Previous notes and tests were reviewed. The plan was reviewed with the patient/family, and all questions/concerned were addressed.  It was my pleasure to see Michelle Crawford today and participate in her care. Please feel free to contact me with any questions or concerns.  Sincerely,  Hargis Springer, MD  Allergy & Immunology  [facility]  [place] Office: [phone] "

## 2024-06-07 NOTE — Patient Instructions (Addendum)
 For Acute Flare: Prednisone 10mg  twice daily for 5 days   1.  Continue to perform allergy avoidance measures to: grasses, weeds, trees, dust mite, cat, dog, and cockroach AND Shellfish   2.  Continue to Treat and prevent inflammation:   A.  OTC Nasacort 1 spray each nostril 7 times per week  B.  Montelukast  10 mg tablet 1 time per day  C.  Sinus Rinses daily 10-15 minutes prior to nose sprays   D. OTC antihistamine - can buy cheaply from amazon, [facility]co, [facility] and generic is fine    3.  If needed:   A.  Nasal saline  B.  Pataday  -1 drop each eye twice a day  C.  Pro-air HFA 2 puffs every 4-6 hours  D.  Auvi-Q  0.3, Benadryl, MD/ER evaluation for allergic reaction   4. Return to clinic 6 months   Thank you so much for letting me partake in your care today.  Don't hesitate to reach out if you have any additional concerns!  Hargis Springer, MD  [facility] [place] [place]  Reducing Pollen Exposure  The American Academy of Allergy, Asthma and Immunology suggests the following steps to reduce your exposure to pollen during allergy seasons.    Do not hang sheets or clothing out to dry; pollen may collect on these items. Do not mow lawns or spend time around freshly cut grass; mowing stirs up pollen. Keep windows closed at night.  Keep car windows closed while driving. Minimize morning activities outdoors, a time when pollen counts are usually at their highest. Stay indoors as much as possible when pollen counts or humidity is high and on windy days when pollen tends to remain in the air longer. Use air conditioning when possible.  Many air conditioners have filters that trap the pollen spores. Use a HEPA room air filter to remove pollen form the indoor air you breathe.  Control of Dog or Cat Allergen  Avoidance is the best way to manage a dog or cat allergy. If you have a dog or cat and are allergic to dog or cats, consider removing the dog or cat from the home. If  you have a dog or cat but dont want to find it a new home, or if your family wants a pet even though someone in the household is allergic, here are some strategies that may help keep symptoms at bay:  Keep the pet out of your bedroom and restrict it to only a few rooms. Be advised that keeping the dog or cat in only one room will not limit the allergens to that room. Dont pet, hug or kiss the dog or cat; if you do, wash your hands with soap and water. High-efficiency particulate air (HEPA) cleaners run continuously in a bedroom or living room can reduce allergen levels over time. Regular use of a high-efficiency vacuum cleaner or a central vacuum can reduce allergen levels. Giving your dog or cat a bath at least once a week can reduce airborne allergen.  DUST MITE AVOIDANCE MEASURES:  There are three main measures that need and can be taken to avoid house dust mites:  Reduce accumulation of dust in general -reduce furniture, clothing, carpeting, books, stuffed animals, especially in bedroom  Separate yourself from the dust -use pillow and mattress encasements (can be found at stores such as [employer] or online) -avoid direct exposure to air condition flow -use a HEPA filter device, especially in the bedroom; you  can also use a HEPA filter vacuum cleaner -wipe dust with a moist towel instead of a dry towel or broom when cleaning  Decrease mites and/or their secretions -wash clothing and linen and stuffed animals at highest temperature possible, at least every 2 weeks -stuffed animals can also be placed in a bag and put in a freezer overnight  Despite the above measures, it is impossible to eliminate dust mites or their allergen completely from your home.  With the above measures the burden of mites in your home can be diminished, with the goal of minimizing your allergic symptoms.  Success will be reached only when implementing and using all means together.  Control of  Cockroach Allergen  Cockroach allergen has been identified as an important cause of acute attacks of asthma, especially in urban settings.  There are fifty-five species of cockroach that exist in the [place] , however only three, the American, German and Oriental species produce allergen that can affect patients with Asthma.  Allergens can be obtained from fecal particles, egg casings and secretions from cockroaches.    Remove food sources. Reduce access to water. Seal access and entry points. Spray runways with 0.5-1% Diazinon or Chlorpyrifos Blow boric acid power under stoves and refrigerator. Place bait stations (hydramethylnon) at feeding sites.

## 2024-06-27 ENCOUNTER — Telehealth: Payer: Self-pay

## 2024-06-27 MED ORDER — AZITHROMYCIN 500 MG PO TABS: 500.0000 mg | ORAL_TABLET | Freq: Every day | ORAL | 0 refills | Status: AC

## 2024-06-27 NOTE — Telephone Encounter (Signed)
 Dr. Lorin patient but she is out of office.    Patient was given Prednisone 10 mg bid x 5 days for acute flare up last time she was seen (06/07/24)  Patient states she is not feeling any better. She is very congested and has bad sinus pressure and pain. She states she has a little bit of cough and a headache. She is also blowing green mucus.   She would like to know what she can take for this issue.   [facility] [place]

## 2024-06-27 NOTE — Telephone Encounter (Signed)
Patient informed and verbalized understanding. Rx sent to pharmacy. 

## 2024-06-27 NOTE — Addendum Note (Signed)
 Addended by: Magdaleno Lortie on: 06/27/2024 12:05 PM   Modules accepted: Orders
